# Patient Record
Sex: Female | Born: 1970 | Race: White | Hispanic: No | Marital: Married | Smoking: Never smoker
Health system: Southern US, Community
[De-identification: ages and names within clinical notes are randomized; demographics above are authoritative.]

## PROBLEM LIST (undated history)

## (undated) DIAGNOSIS — C801 Malignant (primary) neoplasm, unspecified: Secondary | ICD-10-CM

## (undated) DIAGNOSIS — F32A Depression, unspecified: Secondary | ICD-10-CM

## (undated) DIAGNOSIS — I10 Essential (primary) hypertension: Secondary | ICD-10-CM

## (undated) DIAGNOSIS — I499 Cardiac arrhythmia, unspecified: Secondary | ICD-10-CM

## (undated) DIAGNOSIS — T753XXA Motion sickness, initial encounter: Secondary | ICD-10-CM

## (undated) DIAGNOSIS — N979 Female infertility, unspecified: Secondary | ICD-10-CM

## (undated) DIAGNOSIS — I471 Supraventricular tachycardia, unspecified: Secondary | ICD-10-CM

## (undated) DIAGNOSIS — M1990 Unspecified osteoarthritis, unspecified site: Secondary | ICD-10-CM

## (undated) DIAGNOSIS — R87619 Unspecified abnormal cytological findings in specimens from cervix uteri: Secondary | ICD-10-CM

## (undated) DIAGNOSIS — F329 Major depressive disorder, single episode, unspecified: Secondary | ICD-10-CM

## (undated) DIAGNOSIS — IMO0002 Reserved for concepts with insufficient information to code with codable children: Secondary | ICD-10-CM

## (undated) HISTORY — PX: OTHER SURGICAL HISTORY: SHX169

## (undated) HISTORY — DX: Supraventricular tachycardia, unspecified: I47.10

## (undated) HISTORY — PX: ABLATION: SHX5711

## (undated) HISTORY — DX: Supraventricular tachycardia: I47.1

## (undated) HISTORY — DX: Essential (primary) hypertension: I10

## (undated) HISTORY — PX: DIAGNOSTIC LAPAROSCOPY: SUR761

## (undated) HISTORY — DX: Unspecified abnormal cytological findings in specimens from cervix uteri: R87.619

## (undated) HISTORY — PX: LABIAL ADHESION LYSIS: SHX324

## (undated) HISTORY — DX: Reserved for concepts with insufficient information to code with codable children: IMO0002

## (undated) HISTORY — PX: APPENDECTOMY: SHX54

## (undated) HISTORY — PX: LAPAROSCOPIC OVARIAN CYSTECTOMY: SUR786

## (undated) HISTORY — PX: ABDOMINAL HYSTERECTOMY: SHX81

---

## 1996-05-23 ENCOUNTER — Encounter: Payer: Self-pay | Admitting: Internal Medicine

## 1996-06-12 ENCOUNTER — Encounter: Payer: Self-pay | Admitting: Internal Medicine

## 2004-12-07 ENCOUNTER — Ambulatory Visit: Payer: Self-pay | Admitting: Internal Medicine

## 2005-10-18 ENCOUNTER — Ambulatory Visit: Payer: Self-pay | Admitting: Unknown Physician Specialty

## 2007-09-13 ENCOUNTER — Emergency Department: Payer: Self-pay | Admitting: Emergency Medicine

## 2007-09-13 ENCOUNTER — Encounter: Payer: Self-pay | Admitting: Internal Medicine

## 2007-09-13 ENCOUNTER — Other Ambulatory Visit: Payer: Self-pay

## 2007-09-17 ENCOUNTER — Encounter: Payer: Self-pay | Admitting: Internal Medicine

## 2007-10-18 ENCOUNTER — Encounter: Payer: Self-pay | Admitting: Internal Medicine

## 2009-09-07 ENCOUNTER — Encounter: Payer: Self-pay | Admitting: Internal Medicine

## 2009-09-15 ENCOUNTER — Encounter: Payer: Self-pay | Admitting: Internal Medicine

## 2009-10-05 ENCOUNTER — Ambulatory Visit: Payer: Self-pay | Admitting: Internal Medicine

## 2009-10-05 ENCOUNTER — Telehealth: Payer: Self-pay | Admitting: Internal Medicine

## 2009-10-05 DIAGNOSIS — R002 Palpitations: Secondary | ICD-10-CM

## 2009-10-05 DIAGNOSIS — I456 Pre-excitation syndrome: Secondary | ICD-10-CM | POA: Insufficient documentation

## 2009-10-05 DIAGNOSIS — I10 Essential (primary) hypertension: Secondary | ICD-10-CM

## 2009-10-12 ENCOUNTER — Ambulatory Visit: Payer: Self-pay | Admitting: Unknown Physician Specialty

## 2009-11-18 ENCOUNTER — Ambulatory Visit: Payer: Self-pay | Admitting: Internal Medicine

## 2009-11-19 LAB — CONVERTED CEMR LAB
CO2: 20 meq/L (ref 19–32)
Chloride: 105 meq/L (ref 96–112)
Hemoglobin: 14.8 g/dL (ref 12.0–15.0)
INR: 0.97 (ref <1.50)
Potassium: 4.6 meq/L (ref 3.5–5.3)
RBC: 4.84 M/uL (ref 3.87–5.11)
Sodium: 140 meq/L (ref 135–145)
WBC: 8.8 10*3/uL (ref 4.0–10.5)

## 2009-11-20 ENCOUNTER — Ambulatory Visit: Payer: Self-pay | Admitting: Internal Medicine

## 2009-11-20 ENCOUNTER — Ambulatory Visit (HOSPITAL_COMMUNITY): Admission: RE | Admit: 2009-11-20 | Discharge: 2009-11-21 | Payer: Self-pay | Admitting: Internal Medicine

## 2009-11-21 ENCOUNTER — Encounter: Payer: Self-pay | Admitting: Internal Medicine

## 2010-01-12 ENCOUNTER — Ambulatory Visit: Payer: Self-pay | Admitting: Internal Medicine

## 2010-03-12 ENCOUNTER — Encounter: Payer: Self-pay | Admitting: Internal Medicine

## 2010-10-27 ENCOUNTER — Ambulatory Visit: Payer: Self-pay | Admitting: Unknown Physician Specialty

## 2011-01-18 NOTE — Letter (Signed)
Summary: Discharge Summary  Discharge Summary   Imported By: Harlon Flor 04/01/2010 12:08:09  _____________________________________________________________________  External Attachment:    Type:   Image     Comment:   External Document

## 2011-01-18 NOTE — Assessment & Plan Note (Signed)
Summary: eph/jss   Visit Type:  Follow-up Referring Provider:  Elnita Maxwell JEFFERIES, MD Primary Provider:  Francia Greaves, MD   History of Present Illness: Mrs. Nicole Avila returns today for followup of her SVT.  She is a pleasant 40 yo woman with a h/o symptomatic SVT who underwent EPS/RFA of SVT several weeks ago.  She has had no recurrent symptoms and denies c/p, sob, or peripheral edema.  She recently went on a trip with her husband to the [place].  Current Medications (verified): 1)  Triamterene-Hctz 37.5-25 Mg Tabs (Triamterene-Hctz) .Marland Kitchen.. 1 By Mouth Once Daily 2)  Daily Multi  Tabs (Multiple Vitamins-Minerals) .Marland Kitchen.. 1 By Mouth Once Daily 3)  Advil 200 Mg Tabs (Ibuprofen) .Marland Kitchen.. 1 As Needed 4)  Aspirin 81 Mg Tbec (Aspirin) .... Take One Tablet By Mouth Daily  Allergies (verified): No Known Drug Allergies  Past History:  Past Medical History: Last updated: 10/05/2009 GENITAL HERPES ABNORMAL PAP SMEAR HISTORY OF SVT HYPERTENSION  Past Surgical History: Last updated: 10/05/2009 RUPURED APPENDIX CYST REMOVED FROM THE ANKLE HYSTEROSCOPY AND LAPAROTOMY  Review of Systems  The patient denies chest pain, syncope, dyspnea on exertion, and peripheral edema.    Vital Signs:  Patient profile:   40 year old female Height:      70 inches Weight:      231 pounds BMI:     33.26 Pulse rate:   85 / minute BP sitting:   122 / 70  (left arm)  Vitals Entered By: Laurance Flatten CMA (January 12, 2010 12:23 PM)  Physical Exam  General:  Well developed, well nourished, in no acute distress.  HEENT: normal Neck: supple. No JVD. Carotids 2+ bilaterally no bruits Cor: RRR no rubs, gallops or murmur Lungs: CTA Ab: soft, nontender. nondistended. No HSM. Good bowel sounds Ext: warm. no cyanosis, clubbing or edema Neuro: alert and oriented. Grossly nonfocal. affect pleasant    EKG  Procedure date:  01/12/2010  Findings:      Normal sinus rhythm with rate of:  85. IRBBB.  Impression &  Recommendations:  Problem # 1:  WOLFF (WOLFE)-PARKINSON-WHITE (WPW) SYNDROME (ICD-426.7) She has no additional evidence of WPW and no symptoms of SVT.  Will ask her to followup as needed. The following medications were removed from the medication list:    Metoprolol Tartrate 25 Mg Tabs (Metoprolol tartrate) .Marland Kitchen... Take one tablet by mouth twice a day Her updated medication list for this problem includes:    Aspirin 81 Mg Tbec (Aspirin) .Marland Kitchen... Take one tablet by mouth daily  Orders: EKG w/ Interpretation (93000)  Problem # 2:  ESSENTIAL HYPERTENSION, BENIGN (ICD-401.1) Her blood pressure is well controlled. The following medications were removed from the medication list:    Metoprolol Tartrate 25 Mg Tabs (Metoprolol tartrate) .Marland Kitchen... Take one tablet by mouth twice a day Her updated medication list for this problem includes:    Triamterene-hctz 37.5-25 Mg Tabs (Triamterene-hctz) .Marland Kitchen... 1 by mouth once daily    Aspirin 81 Mg Tbec (Aspirin) .Marland Kitchen... Take one tablet by mouth daily  Patient Instructions: 1)  Your physician recommends that you schedule a follow-up appointment as needed

## 2011-01-18 NOTE — Letter (Signed)
Summary: Medical Record Release  Medical Record Release   Imported By: Harlon Flor 04/01/2010 12:09:23  _____________________________________________________________________  External Attachment:    Type:   Image     Comment:   External Document

## 2011-07-12 ENCOUNTER — Encounter: Payer: Self-pay | Admitting: Internal Medicine

## 2011-11-07 ENCOUNTER — Ambulatory Visit: Payer: Self-pay | Admitting: Unknown Physician Specialty

## 2016-02-02 ENCOUNTER — Other Ambulatory Visit: Payer: Self-pay

## 2016-02-02 ENCOUNTER — Encounter: Payer: Self-pay | Admitting: *Deleted

## 2016-02-08 ENCOUNTER — Encounter
Admission: RE | Admit: 2016-02-08 | Discharge: 2016-02-08 | Disposition: A | Discharge status: Home / Self Care | Payer: BLUE CROSS/BLUE SHIELD | Source: Ambulatory Visit | Attending: Obstetrics & Gynecology | Admitting: Obstetrics & Gynecology

## 2016-02-08 ENCOUNTER — Encounter: Payer: Self-pay | Admitting: *Deleted

## 2016-02-08 DIAGNOSIS — I10 Essential (primary) hypertension: Secondary | ICD-10-CM | POA: Diagnosis not present

## 2016-02-08 DIAGNOSIS — F329 Major depressive disorder, single episode, unspecified: Secondary | ICD-10-CM | POA: Diagnosis not present

## 2016-02-08 DIAGNOSIS — N939 Abnormal uterine and vaginal bleeding, unspecified: Secondary | ICD-10-CM | POA: Diagnosis present

## 2016-02-08 DIAGNOSIS — N840 Polyp of corpus uteri: Secondary | ICD-10-CM | POA: Diagnosis not present

## 2016-02-08 DIAGNOSIS — Z79899 Other long term (current) drug therapy: Secondary | ICD-10-CM | POA: Diagnosis not present

## 2016-02-08 DIAGNOSIS — Z8249 Family history of ischemic heart disease and other diseases of the circulatory system: Secondary | ICD-10-CM | POA: Diagnosis not present

## 2016-02-08 DIAGNOSIS — I471 Supraventricular tachycardia: Secondary | ICD-10-CM | POA: Diagnosis not present

## 2016-02-08 DIAGNOSIS — R938 Abnormal findings on diagnostic imaging of other specified body structures: Secondary | ICD-10-CM | POA: Diagnosis not present

## 2016-02-08 LAB — CBC
HEMATOCRIT: 39.6 % (ref 35.0–47.0)
Hemoglobin: 13.8 g/dL (ref 12.0–16.0)
MCH: 30.8 pg (ref 26.0–34.0)
MCHC: 35 g/dL (ref 32.0–36.0)
MCV: 88.1 fL (ref 80.0–100.0)
Platelets: 285 10*3/uL (ref 150–440)
RBC: 4.49 MIL/uL (ref 3.80–5.20)
RDW: 13.8 % (ref 11.5–14.5)
WBC: 7.2 10*3/uL (ref 3.6–11.0)

## 2016-02-08 LAB — BASIC METABOLIC PANEL
Anion gap: 5 (ref 5–15)
BUN: 11 mg/dL (ref 6–20)
CHLORIDE: 102 mmol/L (ref 101–111)
CO2: 31 mmol/L (ref 22–32)
CREATININE: 0.68 mg/dL (ref 0.44–1.00)
Calcium: 9.7 mg/dL (ref 8.9–10.3)
GFR calc non Af Amer: >60 mL/min (ref >60)
GLUCOSE: 102 mg/dL — AB (ref 65–99)
Potassium: 3.4 mmol/L — ABNORMAL LOW (ref 3.5–5.1)
Sodium: 138 mmol/L (ref 135–145)

## 2016-02-08 LAB — TYPE AND SCREEN
ABO/RH(D): A NEG
Antibody Screen: NEGATIVE

## 2016-02-08 NOTE — Patient Instructions (Signed)
  Your procedure is scheduled on: 02/09/16 Report to Day Surgery. [facility] To find out your arrival time please call [phone] between 1PM - 3PM on 02/08/16  Remember: Instructions that are not followed completely may result in serious medical risk, up to and including death, or upon the discretion of your surgeon and anesthesiologist your surgery may need to be rescheduled.    _X_ 1. Do not eat food or drink liquids after midnight. No gum chewing or hard candies.     __X__ 2. No Alcohol for 24 hours before or after surgery.   ____ 3. Bring all medications with you on the day of surgery if instructed.    _X___ 4. Notify your doctor if there is any change in your medical condition     (cold, fever, infections).     Do not wear jewelry, make-up, hairpins, clips or nail polish.  Do not wear lotions, powders, or perfumes. You may wear deodorant.  Do not shave 48 hours prior to surgery. Men may shave face and neck.  Do not bring valuables to the hospital.    [facility] is not responsible for any belongings or valuables.               Contacts, dentures or bridgework may not be worn into surgery.  Leave your suitcase in the car. After surgery it may be brought to your room.  For patients admitted to the hospital, discharge time is determined by your                treatment team.   Patients discharged the day of surgery will not be allowed to drive home.   Please read over the following fact sheets that you were given:   Surgical Site Infection Prevention   ____ Take these medicines the morning of surgery with A SIP OF WATER:    1. NONE  2.   3.   4.  5.  6.  ____ Fleet Enema (as directed)   ____ Use CHG Soap as directed  ____ Use inhalers on the day of surgery  ____ Stop metformin 2 days prior to surgery    ____ Take 1/2 of usual insulin dose the night before surgery and none on the morning of surgery.   ____ Stop Coumadin/Plavix/aspirin on  __X__  Stop Anti-inflammatories on  NO IBUPROFEN UNTIL AFTER SURGERY   ____ Stop supplements until after surgery.    ____ Bring C-Pap to the hospital.

## 2016-02-09 ENCOUNTER — Encounter: Payer: Self-pay | Admitting: *Deleted

## 2016-02-09 ENCOUNTER — Ambulatory Visit: Payer: BLUE CROSS/BLUE SHIELD | Admitting: Anesthesiology

## 2016-02-09 ENCOUNTER — Ambulatory Visit
Admission: RE | Admit: 2016-02-09 | Discharge: 2016-02-09 | Disposition: A | Discharge status: Home / Self Care | Payer: BLUE CROSS/BLUE SHIELD | Source: Ambulatory Visit | Attending: Obstetrics & Gynecology | Admitting: Obstetrics & Gynecology

## 2016-02-09 ENCOUNTER — Encounter
Admission: RE | Disposition: A | Discharge status: Home / Self Care | Payer: Self-pay | Source: Ambulatory Visit | Attending: Obstetrics & Gynecology

## 2016-02-09 DIAGNOSIS — I471 Supraventricular tachycardia: Secondary | ICD-10-CM | POA: Insufficient documentation

## 2016-02-09 DIAGNOSIS — R938 Abnormal findings on diagnostic imaging of other specified body structures: Secondary | ICD-10-CM | POA: Insufficient documentation

## 2016-02-09 DIAGNOSIS — N840 Polyp of corpus uteri: Secondary | ICD-10-CM | POA: Insufficient documentation

## 2016-02-09 DIAGNOSIS — N939 Abnormal uterine and vaginal bleeding, unspecified: Secondary | ICD-10-CM | POA: Insufficient documentation

## 2016-02-09 DIAGNOSIS — F329 Major depressive disorder, single episode, unspecified: Secondary | ICD-10-CM | POA: Insufficient documentation

## 2016-02-09 DIAGNOSIS — Z79899 Other long term (current) drug therapy: Secondary | ICD-10-CM | POA: Insufficient documentation

## 2016-02-09 DIAGNOSIS — Z8249 Family history of ischemic heart disease and other diseases of the circulatory system: Secondary | ICD-10-CM | POA: Insufficient documentation

## 2016-02-09 DIAGNOSIS — I10 Essential (primary) hypertension: Secondary | ICD-10-CM | POA: Insufficient documentation

## 2016-02-09 HISTORY — DX: Cardiac arrhythmia, unspecified: I49.9

## 2016-02-09 HISTORY — PX: HYSTEROSCOPY WITH D & C: SHX1775

## 2016-02-09 HISTORY — DX: Female infertility, unspecified: N97.9

## 2016-02-09 HISTORY — DX: Major depressive disorder, single episode, unspecified: F32.9

## 2016-02-09 HISTORY — DX: Depression, unspecified: F32.A

## 2016-02-09 LAB — ABO/RH: ABO/RH(D): A NEG

## 2016-02-09 LAB — POCT PREGNANCY, URINE: Preg Test, Ur: NEGATIVE

## 2016-02-09 SURGERY — DILATATION AND CURETTAGE /HYSTEROSCOPY
Anesthesia: General

## 2016-02-09 MED ORDER — DEXAMETHASONE SODIUM PHOSPHATE 10 MG/ML IJ SOLN
INTRAMUSCULAR | Status: DC | PRN
  Administered 2016-02-09: 8 mg via INTRAVENOUS

## 2016-02-09 MED ORDER — OXYCODONE HCL 5 MG PO TABS
5.0000 mg | ORAL_TABLET | Freq: Once | ORAL | Status: AC | PRN
  Administered 2016-02-09: 5 mg via ORAL

## 2016-02-09 MED ORDER — LIDOCAINE HCL 1 % IJ SOLN
INTRAMUSCULAR | Status: DC | PRN
  Administered 2016-02-09: 10 mL

## 2016-02-09 MED ORDER — ONDANSETRON HCL 4 MG/2ML IJ SOLN
INTRAMUSCULAR | Status: DC | PRN
  Administered 2016-02-09: 4 mg via INTRAVENOUS

## 2016-02-09 MED ORDER — OXYCODONE HCL 5 MG/5ML PO SOLN: 5.0000 mg | Freq: Once | ORAL | Status: AC | PRN

## 2016-02-09 MED ORDER — FENTANYL CITRATE (PF) 100 MCG/2ML IJ SOLN
INTRAMUSCULAR | Status: AC
  Filled 2016-02-09: qty 2

## 2016-02-09 MED ORDER — FENTANYL CITRATE (PF) 100 MCG/2ML IJ SOLN
INTRAMUSCULAR | Status: DC | PRN
  Administered 2016-02-09: 50 ug via INTRAVENOUS

## 2016-02-09 MED ORDER — OXYCODONE HCL 5 MG PO TABS
ORAL_TABLET | ORAL | Status: AC
  Filled 2016-02-09: qty 1

## 2016-02-09 MED ORDER — FAMOTIDINE 20 MG PO TABS
20.0000 mg | ORAL_TABLET | Freq: Once | ORAL | Status: AC
  Administered 2016-02-09: 20 mg via ORAL

## 2016-02-09 MED ORDER — FAMOTIDINE 20 MG PO TABS
ORAL_TABLET | ORAL | Status: AC
  Filled 2016-02-09: qty 1

## 2016-02-09 MED ORDER — LACTATED RINGERS IV SOLN
INTRAVENOUS | Status: DC
  Administered 2016-02-09 (×2): via INTRAVENOUS

## 2016-02-09 MED ORDER — LIDOCAINE HCL (CARDIAC) 20 MG/ML IV SOLN
INTRAVENOUS | Status: DC | PRN
  Administered 2016-02-09: 40 mg via INTRAVENOUS

## 2016-02-09 MED ORDER — KETOROLAC TROMETHAMINE 30 MG/ML IJ SOLN
INTRAMUSCULAR | Status: AC
  Filled 2016-02-09: qty 1

## 2016-02-09 MED ORDER — FENTANYL CITRATE (PF) 100 MCG/2ML IJ SOLN
25.0000 ug | INTRAMUSCULAR | Status: DC | PRN
  Administered 2016-02-09: 50 ug via INTRAVENOUS
  Administered 2016-02-09: 25 ug via INTRAVENOUS
  Administered 2016-02-09: 50 ug via INTRAVENOUS

## 2016-02-09 MED ORDER — PROPOFOL 10 MG/ML IV BOLUS
INTRAVENOUS | Status: DC | PRN
  Administered 2016-02-09: 170 mg via INTRAVENOUS

## 2016-02-09 MED ORDER — KETOROLAC TROMETHAMINE 30 MG/ML IJ SOLN
30.0000 mg | Freq: Four times a day (QID) | INTRAMUSCULAR | Status: DC
Start: 2016-02-09 — End: 2016-02-09
  Administered 2016-02-09: 30 mg via INTRAVENOUS

## 2016-02-09 MED ORDER — MIDAZOLAM HCL 2 MG/2ML IJ SOLN
INTRAMUSCULAR | Status: DC | PRN
  Administered 2016-02-09: 2 mg via INTRAVENOUS

## 2016-02-09 MED ORDER — LIDOCAINE HCL (PF) 1 % IJ SOLN
INTRAMUSCULAR | Status: AC
  Filled 2016-02-09: qty 30

## 2016-02-09 SURGICAL SUPPLY — 22 items
CATH ROBINSON RED A/P 16FR (CATHETERS) ×3 IMPLANT
CORD URO TURP 10FT (MISCELLANEOUS) ×3 IMPLANT
ELECT REM PT RETURN 9FT ADLT (ELECTROSURGICAL) ×3
ELECT RESECT POWERBALL 24F (MISCELLANEOUS) ×3 IMPLANT
ELECTRODE REM PT RTRN 9FT ADLT (ELECTROSURGICAL) ×1 IMPLANT
GLOVE BIOGEL PI IND STRL 6.5 (GLOVE) ×1 IMPLANT
GLOVE BIOGEL PI IND STRL 7.5 (GLOVE) ×1 IMPLANT
GLOVE BIOGEL PI INDICATOR 6.5 (GLOVE) ×2
GLOVE BIOGEL PI INDICATOR 7.5 (GLOVE) ×2
GLOVE SURG SYN 6.5 ES PF (GLOVE) ×3 IMPLANT
GOWN STRL REUS W/ TWL LRG LVL3 (GOWN DISPOSABLE) ×2 IMPLANT
GOWN STRL REUS W/TWL LRG LVL3 (GOWN DISPOSABLE) ×4
IV LACTATED RINGERS 1000ML (IV SOLUTION) ×3 IMPLANT
KIT RM TURNOVER CYSTO AR (KITS) ×3 IMPLANT
NEEDLE SPNL 22GX3.5 QUINCKE BK (NEEDLE) IMPLANT
PACK DNC HYST (MISCELLANEOUS) ×3 IMPLANT
PAD OB MATERNITY 4.3X12.25 (PERSONAL CARE ITEMS) ×3 IMPLANT
PAD PREP 24X41 OB/GYN DISP (PERSONAL CARE ITEMS) ×3 IMPLANT
SYRINGE 10CC LL (SYRINGE) ×3 IMPLANT
TUBING CONNECTING 10 (TUBING) ×2 IMPLANT
TUBING CONNECTING 10' (TUBING) ×1
TUBING HYSTEROSCOPY DOLPHIN (MISCELLANEOUS) ×3 IMPLANT

## 2016-02-09 NOTE — Transfer of Care (Signed)
Immediate Anesthesia Transfer of Care Note  Patient: Nicole Avila  Procedure(s) Performed: Procedure(s): DILATATION AND CURETTAGE /HYSTEROSCOPY (N/A)  Patient Location: PACU  Anesthesia Type:General  Level of Consciousness: sedated  Airway & Oxygen Therapy: Patient Spontanous Breathing and Patient connected to face mask oxygen  Post-op Assessment: Report given to RN and Post -op Vital signs reviewed and stable  Post vital signs: Reviewed and stable  Last Vitals:  Filed Vitals:   02/09/16 0622 02/09/16 0834  BP: 141/75 133/83  Pulse: 71 67  Temp: 36.6 C 36.6 C  Resp: 16 16    Complications: No apparent anesthesia complications

## 2016-02-09 NOTE — Anesthesia Procedure Notes (Signed)
Procedure Name: LMA Insertion Date/Time: 02/09/2016 7:40 AM Performed by: Allean Found Pre-anesthesia Checklist: Patient identified, Emergency Drugs available, Suction available, Patient being monitored and Timeout performed Patient Re-evaluated:Patient Re-evaluated prior to inductionOxygen Delivery Method: Circle system utilized Preoxygenation: Pre-oxygenation with 100% oxygen Intubation Type: IV induction Ventilation: Mask ventilation without difficulty LMA: LMA inserted LMA Size: 4.0 Number of attempts: 1 Placement Confirmation: positive ETCO2 and breath sounds checked- equal and bilateral Tube secured with: Tape Dental Injury: Teeth and Oropharynx as per pre-operative assessment

## 2016-02-09 NOTE — Anesthesia Preprocedure Evaluation (Signed)
Anesthesia Evaluation  Patient identified by MRN, date of birth, ID band Patient awake    Reviewed: Allergy & Precautions, H&P , NPO status , Patient's Chart, lab work & pertinent test results  History of Anesthesia Complications Negative for: history of anesthetic complications  Airway Mallampati: II  TM Distance: >3 FB Neck ROM: full    Dental  (+) Poor Dentition   Pulmonary neg pulmonary ROS, neg shortness of breath,    Pulmonary exam normal breath sounds clear to auscultation       Cardiovascular Exercise Tolerance: Good hypertension, (-) angina(-) DOE Normal cardiovascular exam+ dysrhythmias (s/p ablation) Supra Ventricular Tachycardia  Rhythm:regular Rate:Normal     Neuro/Psych PSYCHIATRIC DISORDERS Depression negative neurological ROS     GI/Hepatic negative GI ROS, Neg liver ROS,   Endo/Other  negative endocrine ROS  Renal/GU negative Renal ROS  negative genitourinary   Musculoskeletal   Abdominal   Peds  Hematology negative hematology ROS (+)   Anesthesia Other Findings Past Medical History:   Genital herpes                                               Abnormal finding on Pap smear                                SVT (supraventricular tachycardia) (HCC)                       Comment:hx   HTN (hypertension)                                           Dysrhythmia                                                  Infertility, female                                          Depression                                                     Comment:panic attacks  Past Surgical History:   APPENDECTOMY                                                    Comment:ruptured   cyst removed - ankle                                          hysterectomy and laparotomy  DIAGNOSTIC LAPAROSCOPY                                        LABIAL ADHESION LYSIS                                          LAPAROSCOPIC OVARIAN CYSTECTOMY                 Left              ABLATION                                                        Comment:cardiac 2011   no followed by cardio  BMI    Body Mass Index   32.14 kg/m 2      Reproductive/Obstetrics negative OB ROS                             Anesthesia Physical Anesthesia Plan  ASA: III  Anesthesia Plan: General LMA   Post-op Pain Management:    Induction:   Airway Management Planned:   Additional Equipment:   Intra-op Plan:   Post-operative Plan:   Informed Consent: I have reviewed the patients History and Physical, chart, labs and discussed the procedure including the risks, benefits and alternatives for the proposed anesthesia with the patient or authorized representative who has indicated his/her understanding and acceptance.   Dental Advisory Given  Plan Discussed with: Anesthesiologist, CRNA and Surgeon  Anesthesia Plan Comments:         Anesthesia Quick Evaluation

## 2016-02-09 NOTE — Anesthesia Postprocedure Evaluation (Signed)
Anesthesia Post Note  Patient: Nicole Avila  Procedure(s) Performed: Procedure(s) (LRB): DILATATION AND CURETTAGE /HYSTEROSCOPY (N/A)  Patient location during evaluation: PACU Anesthesia Type: General Level of consciousness: awake and alert Pain management: pain level controlled Vital Signs Assessment: post-procedure vital signs reviewed and stable Respiratory status: spontaneous breathing, nonlabored ventilation, respiratory function stable and patient connected to nasal cannula oxygen Cardiovascular status: blood pressure returned to baseline and stable Postop Assessment: no signs of nausea or vomiting Anesthetic complications: no    Last Vitals:  Filed Vitals:   02/09/16 0922 02/09/16 0936  BP: 148/60 127/67  Pulse: 65 67  Temp: 36.6 C   Resp: 14 14    Last Pain:  Filed Vitals:   02/09/16 0938  PainSc: 4                  Precious Haws Piscitello

## 2016-02-09 NOTE — H&P (Addendum)
H&P Update  PLEASE SEE PAPER H&P  Pt was last seen in my office, and complete history and physical performed.  The surgical history has been reviewed and remains accurate without interval change. The patient was re-examined and patient's physiologic condition has not changed significantly in the last 30 days.  No new pharmacological allergies or types of therapy has been initiated.  No Known Allergies  Past Medical History  Diagnosis Date  . Genital herpes   . Abnormal finding on Pap smear   . SVT (supraventricular tachycardia) (HCC)     hx  . HTN (hypertension)   . Dysrhythmia   . Infertility, female   . Depression     panic attacks   Past Surgical History  Procedure Laterality Date  . Appendectomy      ruptured  . Cyst removed - ankle    . Hysterectomy and laparotomy    . Diagnostic laparoscopy    . Labial adhesion lysis    . Laparoscopic ovarian cystectomy Left   . Ablation      cardiac 2011   no followed by cardio    BP 141/75 mmHg  Pulse 71  Temp(Src) 97.9 F (36.6 C) (Oral)  Resp 16  Ht 5\' 10"  (1.778 m)  Wt 101.606 kg (224 lb)  BMI 32.14 kg/m2  SpO2 98%  LMP 12/08/2015 (Approximate)  NAD RRR no murmurs CTAB, no wheezing, resps unlabored +BS, soft, NTTP No c/c/e Pelvic exam deferred  The above history was confirmed with the patient. The condition still exists that makes this procedure necessary. Surgical plan includes hysteroscopy and dilation curettage, as confirmed on the consent. The treatment plan remains the same, without new options for care.  The patient understands the potential benefits and risks and the consents have been signed and placed on the chart.     Larey Days, MD Attending Obstetrician Gynecologist [facility]  ADDENDUM:  Patient's past medical history is listed in our computer system as "hysterectomy"  As I do not enter these surgeries, I do not know how to change them.  This is erroneous.  She  has had a hysteroscopy.  Her uterus is still intact. CW

## 2016-02-09 NOTE — Discharge Instructions (Signed)
You should expect to have some cramping and vaginal bleeding for about a week. This should taper off and subside, much like a period. If heavy bleeding continues or gets worse, you should contact the office for an earlier appointment.   Please call the office or physician on call for fever >101, severe pain, and heavy bleeding.   [phone]  NOTHING IN THE VAGINA FOR 2 WEEKS!!  Use Ibuprofen, Tylenol, and heating pad for cramps.General Anesthesia, Adult General anesthesia is a sleep-like state of non-feeling produced by medicines (anesthetics). General anesthesia prevents you from being alert and feeling pain during a medical procedure. Your caregiver may recommend general anesthesia if your procedure:  Is long.  Is painful or uncomfortable.  Would be frightening to see or hear.  Requires you to be still.  Affects your breathing.  Causes significant blood loss. LET YOUR CAREGIVER KNOW ABOUT:  Allergies to food or medicine.  Medicines taken, including vitamins, herbs, eyedrops, over-the-counter medicines, and creams.  Use of steroids (by mouth or creams).  Previous problems with anesthetics or numbing medicines, including problems experienced by relatives.  History of bleeding problems or blood clots.  Previous surgeries and types of anesthetics received.  Possibility of pregnancy, if this applies.  Use of cigarettes, alcohol, or illegal drugs.  Any health condition(s), especially diabetes, sleep apnea, and high blood pressure. RISKS AND COMPLICATIONS General anesthesia rarely causes complications. However, if complications do occur, they can be life threatening. Complications include:  A lung infection.  A stroke.  A heart attack.  Waking up during the procedure. When this occurs, the patient may be unable to move and communicate that he or she is awake. The patient may feel severe pain. Older adults and adults with serious medical problems are more likely to have  complications than adults who are young and healthy. Some complications can be prevented by answering all of your caregiver's questions thoroughly and by following all pre-procedure instructions. It is important to tell your caregiver if any of the pre-procedure instructions, especially those related to diet, were not followed. Any food or liquid in the stomach can cause problems when you are under general anesthesia. BEFORE THE PROCEDURE  Ask your caregiver if you will have to spend the night at the hospital. If you will not have to spend the night, arrange to have an adult drive you and stay with you for 24 hours.  Follow your caregiver's instructions if you are taking dietary supplements or medicines. Your caregiver may tell you to stop taking them or to reduce your dosage.  Do not smoke for as long as possible before your procedure. If possible, stop smoking 3-6 weeks before the procedure.  Do not take new dietary supplements or medicines within 1 week of your procedure unless your caregiver approves them.  Do not eat within 8 hours of your procedure or as directed by your caregiver. Drink only clear liquids, such as water, black coffee (without milk or cream), and fruit juices (without pulp).  Do not drink within 3 hours of your procedure or as directed by your caregiver.  You may brush your teeth on the morning of the procedure, but make sure to spit out the toothpaste and water when finished. PROCEDURE  You will receive anesthetics through a mask, through an intravenous (IV) access tube, or through both. A doctor who specializes in anesthesia (anesthesiologist) or a nurse who specializes in anesthesia (nurse anesthetist) or both will stay with you throughout the procedure to make sure  you remain unconscious. He or she will also watch your blood pressure, pulse, and oxygen levels to make sure that the anesthetics do not cause any problems. Once you are asleep, a breathing tube or mask may be  used to help you breathe. AFTER THE PROCEDURE You will wake up after the procedure is complete. You may be in the room where the procedure was performed or in a recovery area. You may have a sore throat if a breathing tube was used. You may also feel:  Dizzy.  Weak.  Drowsy.  Confused.  Nauseous.  Cold. These are all normal responses and can be expected to last for up to 24 hours after the procedure is complete. A caregiver will tell you when you are ready to go home. This will usually be when you are fully awake and in stable condition.   This information is not intended to replace advice given to you by your health care provider. Make sure you discuss any questions you have with your health care provider.   Document Released: 03/13/2008 Document Revised: 12/26/2014 Document Reviewed: 04/04/2012 Elsevier Interactive Patient Education [employer].

## 2016-02-09 NOTE — Op Note (Signed)
Operative Report Hysteroscopy, Dilation and Curettage 02/09/2016  Patient:  Nicole Avila  45 y.o. female Preoperative diagnosis:  ABNORMAL UTERINE BLEEDING,THICKENED ENDOMETRIUM Postoperative diagnosis:  ABNORMAL UTERINE BLEEDING, Uterine polyps  PROCEDURE:  Procedure(s): DILATATION AND CURETTAGE /HYSTEROSCOPY (N/A) Surgeon:  Surgeon(s) and Role:    * Chelsea Loletha Grayer Ward, MD - Primary Anesthesia:  LMA I/O: Total I/O In: 600 [I.V.:600] Out: - minimal blood loss Specimens:  Endometrial curettings Complications: None Apparent Disposition:  VS stable to PACU  Findings: Uterus, mobile, normal size, sounding to 10.5 cm; normal cervix, vagina, perineum. Hysteroscopic findings:  Small, numerous polyps, one necrotic.  Indication for procedure/Consents: 45 y.o.  here for scheduled surgery for the aforementioned diagnoses.  Risks of surgery were discussed with the patient including but not limited to: bleeding which may require transfusion; infection which may require antibiotics; injury to uterus or surrounding organs; intrauterine scarring which may impair future fertility; need for additional procedures including laparotomy or laparoscopy; and other postoperative/anesthesia complications. Written informed consent was obtained.    Procedure Details:   The patient was then taken to the operating room where anesthesia was administered and was found to be adequate.  After a formal and adequate timeout was performed, she was placed in the dorsal lithotomy position and examined with the above findings. She was then prepped and draped in the sterile manner.  A speculum was then placed in the patient's vagina and a single tooth tenaculum was applied to the anterior lip of the cervix.   The uterus was sounded to 10.5cm. Her cervix was serially dilated to accommodate the  hysteroscope with findings as above. A sharp curettage was then performed until there was a gritty texture in all four quadrants. The  specimen(s) was handed off to nursing.  The camera was reinserted and confirmed the uterus had been evacuated. The tenaculum was removed from the anterior lip of the cervix and the vaginal speculum was removed after noting good hemostasis. The patient tolerated the procedure well and was taken to the recovery area awake, extubated and in stable condition.  The patient will be discharged to home as per PACU criteria.  Routine postoperative instructions given. She will follow up in the clinic in two to four weeks for postoperative evaluation.  Larey Days, MD [facility] OBGYN Attending Gynecologist

## 2016-02-10 LAB — SURGICAL PATHOLOGY

## 2016-04-12 ENCOUNTER — Encounter: Payer: Self-pay | Admitting: *Deleted

## 2016-04-12 ENCOUNTER — Other Ambulatory Visit: Payer: BLUE CROSS/BLUE SHIELD

## 2016-04-12 NOTE — Patient Instructions (Signed)
  Your procedure is scheduled on: 04-14-16 (THURSDAY) Report to [place] To find out your arrival time please call [phone] between 1PM - 3PM on 04-13-16 [facility])  Remember: Instructions that are not followed completely may result in serious medical risk, up to and including death, or upon the discretion of your surgeon and anesthesiologist your surgery may need to be rescheduled.    _X___ 1. Do not eat food or drink liquids after midnight. No gum chewing or hard candies.     _X___ 2. No Alcohol for 24 hours before or after surgery.   ____ 3. Bring all medications with you on the day of surgery if instructed.    _X___ 4. Notify your doctor if there is any change in your medical condition     (cold, fever, infections).     Do not wear jewelry, make-up, hairpins, clips or nail polish.  Do not wear lotions, powders, or perfumes. You may wear deodorant.  Do not shave 48 hours prior to surgery. Men may shave face and neck.  Do not bring valuables to the hospital.    [facility] is not responsible for any belongings or valuables.               Contacts, dentures or bridgework may not be worn into surgery.  Leave your suitcase in the car. After surgery it may be brought to your room.  For patients admitted to the hospital, discharge time is determined by your treatment team.   Patients discharged the day of surgery will not be allowed to drive home.   Please read over the following fact sheets that you were given:      ____ Take these medicines the morning of surgery with A SIP OF WATER:    1. NONE  2.   3.   4.  5.  6.  ____ Fleet Enema (as directed)   ____ Use CHG Soap as directed  ____ Use inhalers on the day of surgery  ____ Stop metformin 2 days prior to surgery    ____ Take 1/2 of usual insulin dose the night before surgery and none on the morning of surgery.   ____ Stop Coumadin/Plavix/aspirin-N/A  _X___ Stop  Anti-inflammatories-STOP IBUPROFEN NOW-NO NSAIDS OR ASPIRIN PRODUCTS-TYLENOL OK TO TAKE   _X___ Stop supplements until after surgery-STOP VITAMIN C NOW   ____ Bring C-Pap to the hospital.

## 2016-04-13 ENCOUNTER — Encounter
Admission: RE | Admit: 2016-04-13 | Discharge: 2016-04-13 | Disposition: A | Discharge status: Home / Self Care | Payer: BLUE CROSS/BLUE SHIELD | Source: Ambulatory Visit | Attending: Obstetrics & Gynecology | Admitting: Obstetrics & Gynecology

## 2016-04-13 DIAGNOSIS — N939 Abnormal uterine and vaginal bleeding, unspecified: Secondary | ICD-10-CM | POA: Diagnosis present

## 2016-04-13 DIAGNOSIS — Z8719 Personal history of other diseases of the digestive system: Secondary | ICD-10-CM | POA: Diagnosis not present

## 2016-04-13 DIAGNOSIS — I471 Supraventricular tachycardia: Secondary | ICD-10-CM | POA: Diagnosis not present

## 2016-04-13 DIAGNOSIS — F329 Major depressive disorder, single episode, unspecified: Secondary | ICD-10-CM | POA: Diagnosis not present

## 2016-04-13 DIAGNOSIS — N888 Other specified noninflammatory disorders of cervix uteri: Secondary | ICD-10-CM | POA: Diagnosis not present

## 2016-04-13 DIAGNOSIS — Z8249 Family history of ischemic heart disease and other diseases of the circulatory system: Secondary | ICD-10-CM | POA: Diagnosis not present

## 2016-04-13 DIAGNOSIS — I10 Essential (primary) hypertension: Secondary | ICD-10-CM | POA: Diagnosis not present

## 2016-04-13 DIAGNOSIS — Z79899 Other long term (current) drug therapy: Secondary | ICD-10-CM | POA: Diagnosis not present

## 2016-04-13 DIAGNOSIS — K660 Peritoneal adhesions (postprocedural) (postinfection): Secondary | ICD-10-CM | POA: Diagnosis not present

## 2016-04-13 DIAGNOSIS — F410 Panic disorder [episodic paroxysmal anxiety] without agoraphobia: Secondary | ICD-10-CM | POA: Diagnosis not present

## 2016-04-13 LAB — BASIC METABOLIC PANEL
Anion gap: 9 (ref 5–15)
BUN: 17 mg/dL (ref 6–20)
CO2: 23 mmol/L (ref 22–32)
Calcium: 9.2 mg/dL (ref 8.9–10.3)
Chloride: 106 mmol/L (ref 101–111)
Creatinine, Ser: 0.76 mg/dL (ref 0.44–1.00)
GFR calc Af Amer: >60 mL/min (ref >60)
GFR calc non Af Amer: >60 mL/min (ref >60)
Glucose, Bld: 125 mg/dL — ABNORMAL HIGH (ref 65–99)
Potassium: 3.6 mmol/L (ref 3.5–5.1)
Sodium: 138 mmol/L (ref 135–145)

## 2016-04-13 LAB — CBC
HEMATOCRIT: 38.9 % (ref 35.0–47.0)
HEMOGLOBIN: 13.6 g/dL (ref 12.0–16.0)
MCH: 30.6 pg (ref 26.0–34.0)
MCHC: 35 g/dL (ref 32.0–36.0)
MCV: 87.4 fL (ref 80.0–100.0)
Platelets: 264 10*3/uL (ref 150–440)
RBC: 4.45 MIL/uL (ref 3.80–5.20)
RDW: 13.7 % (ref 11.5–14.5)
WBC: 8.9 10*3/uL (ref 3.6–11.0)

## 2016-04-13 LAB — TYPE AND SCREEN
ABO/RH(D): A NEG
Antibody Screen: NEGATIVE

## 2016-04-13 NOTE — Pre-Procedure Instructions (Deleted)
Pt came in to PAT to get some labs drawn per surgeons orders-pt was stuck twice by the phlebotomist without getting any blood-pt had told phlebotomist that she could not look at the needle while her blood was being drawn- phlebotomist came to get me to see if I could get blood from pt-Upon walking into room, myself and another nurse observed pt was very pale and was slouched over to the side of the chair, eyes open, trying to hold on to her water. We reclined pt back in the chair and pt came too and was able to communicate with [place] and said that she had not eaten breakfast and that she normally has already eaten at this time.  I kept pts legs elevated and sat her up slightly, put a cold rag over her forehead and she states that she was feeling a lot better.  Blood was drawn and pt tolerated this well. Pt stood up with me beside her and she said she felt fine. I walked her out to the waiting room to her boyfriend who said he would drive her home and i instructed him to get her something to eat.  He verbalized he would and she did also. Reviewed labs once they came back and her glucose was 105.

## 2016-04-14 ENCOUNTER — Ambulatory Visit
Admission: RE | Admit: 2016-04-14 | Discharge: 2016-04-14 | Disposition: A | Discharge status: Home / Self Care | Payer: BLUE CROSS/BLUE SHIELD | Source: Ambulatory Visit | Attending: Obstetrics & Gynecology | Admitting: Obstetrics & Gynecology

## 2016-04-14 ENCOUNTER — Ambulatory Visit: Payer: BLUE CROSS/BLUE SHIELD | Admitting: Anesthesiology

## 2016-04-14 ENCOUNTER — Encounter
Admission: RE | Disposition: A | Discharge status: Home / Self Care | Payer: Self-pay | Source: Ambulatory Visit | Attending: Obstetrics & Gynecology

## 2016-04-14 ENCOUNTER — Encounter: Payer: Self-pay | Admitting: *Deleted

## 2016-04-14 DIAGNOSIS — I471 Supraventricular tachycardia: Secondary | ICD-10-CM | POA: Insufficient documentation

## 2016-04-14 DIAGNOSIS — K660 Peritoneal adhesions (postprocedural) (postinfection): Secondary | ICD-10-CM | POA: Insufficient documentation

## 2016-04-14 DIAGNOSIS — F329 Major depressive disorder, single episode, unspecified: Secondary | ICD-10-CM | POA: Insufficient documentation

## 2016-04-14 DIAGNOSIS — Z8719 Personal history of other diseases of the digestive system: Secondary | ICD-10-CM | POA: Insufficient documentation

## 2016-04-14 DIAGNOSIS — N939 Abnormal uterine and vaginal bleeding, unspecified: Secondary | ICD-10-CM | POA: Insufficient documentation

## 2016-04-14 DIAGNOSIS — I10 Essential (primary) hypertension: Secondary | ICD-10-CM | POA: Insufficient documentation

## 2016-04-14 DIAGNOSIS — Z79899 Other long term (current) drug therapy: Secondary | ICD-10-CM | POA: Insufficient documentation

## 2016-04-14 DIAGNOSIS — Z8249 Family history of ischemic heart disease and other diseases of the circulatory system: Secondary | ICD-10-CM | POA: Insufficient documentation

## 2016-04-14 DIAGNOSIS — N888 Other specified noninflammatory disorders of cervix uteri: Secondary | ICD-10-CM | POA: Insufficient documentation

## 2016-04-14 DIAGNOSIS — F410 Panic disorder [episodic paroxysmal anxiety] without agoraphobia: Secondary | ICD-10-CM | POA: Insufficient documentation

## 2016-04-14 HISTORY — PX: LAPAROSCOPIC HYSTERECTOMY: SHX1926

## 2016-04-14 LAB — POCT PREGNANCY, URINE: Preg Test, Ur: NEGATIVE

## 2016-04-14 SURGERY — HYSTERECTOMY, TOTAL, LAPAROSCOPIC
Anesthesia: General | Laterality: Bilateral | Wound class: Clean Contaminated

## 2016-04-14 MED ORDER — OXYCODONE HCL 5 MG PO TABS
5.0000 mg | ORAL_TABLET | Freq: Once | ORAL | Status: AC | PRN
  Administered 2016-04-14: 5 mg via ORAL

## 2016-04-14 MED ORDER — OXYCODONE HCL 5 MG PO TABS
ORAL_TABLET | ORAL | Status: AC
  Filled 2016-04-14: qty 1

## 2016-04-14 MED ORDER — ROCURONIUM BROMIDE 100 MG/10ML IV SOLN
INTRAVENOUS | Status: DC | PRN
  Administered 2016-04-14 (×2): 10 mg via INTRAVENOUS
  Administered 2016-04-14: 20 mg via INTRAVENOUS

## 2016-04-14 MED ORDER — OXYCODONE HCL 5 MG/5ML PO SOLN
5.0000 mg | Freq: Once | ORAL | Status: AC | PRN
Start: 2016-04-14 — End: 2016-04-14

## 2016-04-14 MED ORDER — ONDANSETRON HCL 4 MG/2ML IJ SOLN
INTRAMUSCULAR | Status: DC | PRN
  Administered 2016-04-14: 4 mg via INTRAVENOUS

## 2016-04-14 MED ORDER — LACTATED RINGERS IV SOLN
INTRAVENOUS | Status: DC
  Administered 2016-04-14: 12:00:00 via INTRAVENOUS

## 2016-04-14 MED ORDER — FAMOTIDINE 20 MG PO TABS
20.0000 mg | ORAL_TABLET | Freq: Once | ORAL | Status: AC
  Administered 2016-04-14: 20 mg via ORAL

## 2016-04-14 MED ORDER — FENTANYL CITRATE (PF) 100 MCG/2ML IJ SOLN
INTRAMUSCULAR | Status: AC
  Administered 2016-04-14: 50 ug via INTRAVENOUS
  Filled 2016-04-14: qty 2

## 2016-04-14 MED ORDER — ACETAMINOPHEN 325 MG PO TABS: 650.0000 mg | ORAL_TABLET | ORAL | Status: DC | PRN

## 2016-04-14 MED ORDER — CEFAZOLIN SODIUM-DEXTROSE 2-4 GM/100ML-% IV SOLN
2.0000 g | Freq: Once | INTRAVENOUS | Status: AC
  Administered 2016-04-14: 2 g via INTRAVENOUS

## 2016-04-14 MED ORDER — HEPARIN SODIUM (PORCINE) 5000 UNIT/ML IJ SOLN
INTRAMUSCULAR | Status: AC
  Filled 2016-04-14: qty 1

## 2016-04-14 MED ORDER — MORPHINE SULFATE (PF) 2 MG/ML IV SOLN: 1.0000 mg | INTRAVENOUS | Status: DC | PRN

## 2016-04-14 MED ORDER — CEFAZOLIN SODIUM-DEXTROSE 2-4 GM/100ML-% IV SOLN
INTRAVENOUS | Status: AC
  Filled 2016-04-14: qty 100

## 2016-04-14 MED ORDER — MIDAZOLAM HCL 5 MG/5ML IJ SOLN
INTRAMUSCULAR | Status: DC | PRN
  Administered 2016-04-14: 2 mg via INTRAVENOUS

## 2016-04-14 MED ORDER — FENTANYL CITRATE (PF) 100 MCG/2ML IJ SOLN
INTRAMUSCULAR | Status: DC | PRN
  Administered 2016-04-14: 50 ug via INTRAVENOUS
  Administered 2016-04-14: 100 ug via INTRAVENOUS
  Administered 2016-04-14: 50 ug via INTRAVENOUS

## 2016-04-14 MED ORDER — SUGAMMADEX SODIUM 200 MG/2ML IV SOLN
INTRAVENOUS | Status: DC | PRN
  Administered 2016-04-14: 200 mg via INTRAVENOUS

## 2016-04-14 MED ORDER — KETOROLAC TROMETHAMINE 30 MG/ML IJ SOLN: 30.0000 mg | Freq: Four times a day (QID) | INTRAMUSCULAR | Status: DC

## 2016-04-14 MED ORDER — PROPOFOL 10 MG/ML IV BOLUS
INTRAVENOUS | Status: DC | PRN
  Administered 2016-04-14: 160 mg via INTRAVENOUS

## 2016-04-14 MED ORDER — HEPARIN SODIUM (PORCINE) 5000 UNIT/ML IJ SOLN
5000.0000 [IU] | Freq: Once | INTRAMUSCULAR | Status: AC
  Administered 2016-04-14: 5000 [IU] via SUBCUTANEOUS

## 2016-04-14 MED ORDER — LIDOCAINE HCL (CARDIAC) 20 MG/ML IV SOLN
INTRAVENOUS | Status: DC | PRN
  Administered 2016-04-14: 60 mg via INTRAVENOUS

## 2016-04-14 MED ORDER — FAMOTIDINE 20 MG PO TABS
ORAL_TABLET | ORAL | Status: AC
  Filled 2016-04-14: qty 1

## 2016-04-14 MED ORDER — DEXAMETHASONE SODIUM PHOSPHATE 10 MG/ML IJ SOLN
INTRAMUSCULAR | Status: DC | PRN
  Administered 2016-04-14: 5 mg via INTRAVENOUS

## 2016-04-14 MED ORDER — FENTANYL CITRATE (PF) 100 MCG/2ML IJ SOLN
25.0000 ug | INTRAMUSCULAR | Status: DC | PRN
  Administered 2016-04-14: 50 ug via INTRAVENOUS
  Administered 2016-04-14 (×2): 25 ug via INTRAVENOUS

## 2016-04-14 MED ORDER — ACETAMINOPHEN 650 MG RE SUPP
650.0000 mg | RECTAL | Status: DC | PRN
  Filled 2016-04-14: qty 1

## 2016-04-14 MED ORDER — LACTATED RINGERS IV SOLN
INTRAVENOUS | Status: DC | PRN
  Administered 2016-04-14: 13:00:00 via INTRAVENOUS

## 2016-04-14 MED ORDER — SUCCINYLCHOLINE CHLORIDE 20 MG/ML IJ SOLN
INTRAMUSCULAR | Status: DC | PRN
  Administered 2016-04-14: 120 mg via INTRAVENOUS

## 2016-04-14 SURGICAL SUPPLY — 48 items
BAG URO DRAIN 2000ML W/SPOUT (MISCELLANEOUS) ×3 IMPLANT
BLADE SURG SZ11 CARB STEEL (BLADE) ×3 IMPLANT
CANISTER SUCT 1200ML W/VALVE (MISCELLANEOUS) ×3 IMPLANT
CATH FOLEY 2WAY  5CC 16FR (CATHETERS) ×2
CATH URTH 16FR FL 2W BLN LF (CATHETERS) ×1 IMPLANT
CHLORAPREP W/TINT 26ML (MISCELLANEOUS) ×6 IMPLANT
DEFOGGER SCOPE WARMER CLEARIFY (MISCELLANEOUS) ×3 IMPLANT
DEVICE SUTURE ENDOST 10MM (ENDOMECHANICALS) IMPLANT
DRAPE LEGGINS SURG 28X43 STRL (DRAPES) ×3 IMPLANT
DRAPE SHEET LG 3/4 BI-LAMINATE (DRAPES) ×3 IMPLANT
DRAPE UNDER BUTTOCK W/FLU (DRAPES) ×3 IMPLANT
GLOVE PI ORTHOPRO 6.5 (GLOVE) ×4
GLOVE PI ORTHOPRO STRL 6.5 (GLOVE) ×2 IMPLANT
GLOVE SURG SYN 6.5 ES PF (GLOVE) ×12 IMPLANT
GOWN STRL REUS W/ TWL LRG LVL3 (GOWN DISPOSABLE) ×3 IMPLANT
GOWN STRL REUS W/ TWL XL LVL3 (GOWN DISPOSABLE) ×1 IMPLANT
GOWN STRL REUS W/TWL LRG LVL3 (GOWN DISPOSABLE) ×6
GOWN STRL REUS W/TWL XL LVL3 (GOWN DISPOSABLE) ×2
HIBICLENS CHG 4% 32OZ (MISCELLANEOUS) ×3 IMPLANT
IRRIGATION STRYKERFLOW (MISCELLANEOUS) ×1 IMPLANT
IRRIGATOR STRYKERFLOW (MISCELLANEOUS) ×3
IV LACTATED RINGERS 1000ML (IV SOLUTION) ×3 IMPLANT
KIT PINK PAD W/HEAD ARE REST (MISCELLANEOUS) ×3
KIT PINK PAD W/HEAD ARM REST (MISCELLANEOUS) ×1 IMPLANT
KIT RM TURNOVER CYSTO AR (KITS) ×3 IMPLANT
L-HOOK LAP DISP 36CM (ELECTROSURGICAL)
LHOOK LAP DISP 36CM (ELECTROSURGICAL) IMPLANT
LIGASURE BLUNT 5MM 37CM (INSTRUMENTS) ×3 IMPLANT
LIQUID BAND (GAUZE/BANDAGES/DRESSINGS) ×3 IMPLANT
MANIPULATOR VCARE LG CRV RETR (MISCELLANEOUS) IMPLANT
MANIPULATOR VCARE SML CRV RETR (MISCELLANEOUS) IMPLANT
MANIPULATOR VCARE STD CRV RETR (MISCELLANEOUS) ×6 IMPLANT
NS IRRIG 500ML POUR BTL (IV SOLUTION) ×3 IMPLANT
PACK LAP CHOLECYSTECTOMY (MISCELLANEOUS) ×3 IMPLANT
PAD OB MATERNITY 4.3X12.25 (PERSONAL CARE ITEMS) ×3 IMPLANT
PAD PREP 24X41 OB/GYN DISP (PERSONAL CARE ITEMS) ×3 IMPLANT
PENCIL ELECTRO HAND CTR (MISCELLANEOUS) ×3 IMPLANT
SCISSORS METZENBAUM CVD 33 (INSTRUMENTS) ×3 IMPLANT
SET CYSTO W/LG BORE CLAMP LF (SET/KITS/TRAYS/PACK) IMPLANT
SLEEVE ENDOPATH XCEL 5M (ENDOMECHANICALS) ×3 IMPLANT
SPONGE XRAY 4X4 16PLY STRL (MISCELLANEOUS) ×3 IMPLANT
SURGILUBE 2OZ TUBE FLIPTOP (MISCELLANEOUS) ×3 IMPLANT
SUT ENDO VLOC 180-0-8IN (SUTURE) IMPLANT
SUT VIC AB 0 CT1 36 (SUTURE) ×9 IMPLANT
SUT VIC AB 2-0 UR6 27 (SUTURE) ×3 IMPLANT
TROCAR BLUNT TIP 12MM OMST12BT (TROCAR) ×3 IMPLANT
TROCAR XCEL NON-BLD 5MMX100MML (ENDOMECHANICALS) ×3 IMPLANT
TUBING INSUFFLATOR HEATED (MISCELLANEOUS) ×3 IMPLANT

## 2016-04-14 NOTE — Discharge Instructions (Signed)
Discharge instructions:  Call office if you have any of the following: fever >101 F, chills, excessive vaginal bleeding, incision drainage or problems, leg pain or redness, or any other concerns.   Activity: Do not lift > 10 lbs for 8 weeks.  No intercourse or tampons for 8 weeks.  No driving for 1-2 weeks.   Expect some pain in your right ribs/upper abdomen and right shoulder.  This is due to the air used during your surgery and will just take time to go away.  Be patient.   Please don't limit yourself in terms of routine activity.  You will be able to do most things, although they may take longer to do or be a little painful.  You can do it!  Don't be a hero, but don't be a wimp either!   AMBULATORY SURGERY  DISCHARGE INSTRUCTIONS   1) The drugs that you were given will stay in your system until tomorrow so for the next 24 hours you should not:  A) Drive an automobile B) Make any legal decisions C) Drink any alcoholic beverage   2) You may resume regular meals tomorrow.  Today it is better to start with liquids and gradually work up to solid foods.  You may eat anything you prefer, but it is better to start with liquids, then soup and crackers, and gradually work up to solid foods.   3) Please notify your doctor immediately if you have any unusual bleeding, trouble breathing, redness and pain at the surgery site, drainage, fever, or pain not relieved by medication.    4) Additional Instructions:        Please contact your physician with any problems or Same Day Surgery at [phone], Monday through Friday 6 am to 4 pm, or [place] at [facility] number at [phone].

## 2016-04-14 NOTE — Anesthesia Preprocedure Evaluation (Signed)
Anesthesia Evaluation  Patient identified by MRN, date of birth, ID band Patient awake    Reviewed: Allergy & Precautions, H&P , NPO status , Patient's Chart, lab work & pertinent test results  History of Anesthesia Complications Negative for: history of anesthetic complications  Airway Mallampati: II  TM Distance: >3 FB Neck ROM: full    Dental  (+) Poor Dentition   Pulmonary neg shortness of breath,    Pulmonary exam normal breath sounds clear to auscultation       Cardiovascular Exercise Tolerance: Good hypertension, (-) angina(-) Past MI and (-) DOE Normal cardiovascular exam+ dysrhythmias Supra Ventricular Tachycardia  Rhythm:regular Rate:Normal     Neuro/Psych PSYCHIATRIC DISORDERS Depression negative neurological ROS     GI/Hepatic negative GI ROS, Neg liver ROS, neg GERD  ,  Endo/Other  negative endocrine ROS  Renal/GU negative Renal ROS  negative genitourinary   Musculoskeletal   Abdominal   Peds  Hematology negative hematology ROS (+)   Anesthesia Other Findings Past Medical History:   Genital herpes                                               Abnormal finding on Pap smear                                SVT (supraventricular tachycardia) (HCC)                       Comment:hx   HTN (hypertension)                                           Dysrhythmia                                                  Infertility, female                                          Depression                                                     Comment:panic attacks  Past Surgical History:   APPENDECTOMY                                                    Comment:ruptured   cyst removed - ankle                                          DIAGNOSTIC LAPAROSCOPY  LABIAL ADHESION LYSIS                                         LAPAROSCOPIC OVARIAN CYSTECTOMY                 Left             ABLATION                                                        Comment:cardiac 2011   no followed by cardio   HYSTEROSCOPY W/D&C                              N/A 02/09/2016      Comment:Procedure: DILATATION AND CURETTAGE               /HYSTEROSCOPY;  Surgeon: Honor Loh Ward, MD;                Location: [facility] ORS;  Service: Gynecology;                Laterality: N/A;  BMI    Body Mass Index   33.71 kg/m 2      Reproductive/Obstetrics negative OB ROS                             Anesthesia Physical Anesthesia Plan  ASA: III  Anesthesia Plan: General ETT   Post-op Pain Management:    Induction:   Airway Management Planned:   Additional Equipment:   Intra-op Plan:   Post-operative Plan:   Informed Consent: I have reviewed the patients History and Physical, chart, labs and discussed the procedure including the risks, benefits and alternatives for the proposed anesthesia with the patient or authorized representative who has indicated his/her understanding and acceptance.   Dental Advisory Given  Plan Discussed with: Anesthesiologist, CRNA and Surgeon  Anesthesia Plan Comments:         Anesthesia Quick Evaluation

## 2016-04-14 NOTE — Anesthesia Postprocedure Evaluation (Signed)
Anesthesia Post Note  Patient: Nicole Avila  Procedure(s) Performed: Procedure(s) (LRB):  TOTAL LAPAROSCOPIC HYSTERECTOMY (Bilateral)  Patient location during evaluation: PACU Anesthesia Type: General Level of consciousness: awake and alert Pain management: pain level controlled Vital Signs Assessment: post-procedure vital signs reviewed and stable Respiratory status: spontaneous breathing, nonlabored ventilation, respiratory function stable and patient connected to nasal cannula oxygen Cardiovascular status: blood pressure returned to baseline and stable Postop Assessment: no signs of nausea or vomiting Anesthetic complications: no    Last Vitals:  Filed Vitals:   04/14/16 1528 04/14/16 1554  BP: 135/78 127/73  Pulse: 76 74  Temp: 37.1 C   Resp: 21 16    Last Pain:  Filed Vitals:   04/14/16 1555  PainSc: 3                  Precious Haws Aldrich Lloyd

## 2016-04-14 NOTE — Transfer of Care (Signed)
Immediate Anesthesia Transfer of Care Note  Patient: Nicole Avila  Procedure(s) Performed: Procedure(s):  TOTAL LAPAROSCOPIC HYSTERECTOMY (Bilateral)  Patient Location: PACU  Anesthesia Type:General  Level of Consciousness: awake and patient cooperative  Airway & Oxygen Therapy: Patient Spontanous Breathing and Patient connected to face mask oxygen  Post-op Assessment: Report given to RN  Post vital signs: Reviewed and stable  Last Vitals:  Filed Vitals:   04/14/16 1114 04/14/16 1445  BP: 154/82 139/77  Pulse: 84 69  Temp: 36.2 C 36.2 C  Resp: 16 16  139/77  Last Pain:  Filed Vitals:   04/14/16 1445  PainSc: 4          Complications: No apparent anesthesia complications

## 2016-04-14 NOTE — H&P (Signed)
H&P Update  PLEASE SEE PAPER H&P  Pt was last seen in my office, and complete history and physical performed.  The surgical history has been reviewed and remains accurate without interval change. The patient was re-examined and patient's physiologic condition has not changed significantly in the last 30 days.  No new pharmacological allergies or types of therapy has been initiated.  No Known Allergies  Past Medical History  Diagnosis Date  . Genital herpes   . Abnormal finding on Pap smear   . SVT (supraventricular tachycardia) (HCC)     hx  . HTN (hypertension)   . Dysrhythmia   . Infertility, female   . Depression     panic attacks   Past Surgical History  Procedure Laterality Date  . Appendectomy      ruptured  . Cyst removed - ankle    . Diagnostic laparoscopy    . Labial adhesion lysis    . Laparoscopic ovarian cystectomy Left   . Ablation      cardiac 2011   no followed by cardio  . Hysteroscopy w/d&c N/A 02/09/2016    Procedure: DILATATION AND CURETTAGE /HYSTEROSCOPY;  Surgeon: Honor Loh Ivory Maduro, MD;  Location: [facility] ORS;  Service: Gynecology;  Laterality: N/A;    BP 154/82 mmHg  Pulse 84  Temp(Src) 97.2 F (36.2 C) (Oral)  Resp 16  Ht 5\' 10"  (1.778 m)  Wt 106.595 kg (235 lb)  BMI 33.72 kg/m2  SpO2 98%  LMP 03/13/2016  NAD RRR no murmurs CTAB, no wheezing, resps unlabored +BS, soft, NTTP No c/c/e Pelvic exam deferred  The above history was confirmed with the patient. The condition still exists that makes this procedure necessary. Surgical plan includes Total laparoscopic hysterectomy, bilateral salpingectomy, possible open, as confirmed on the consent. The treatment plan remains the same, without new options for care.  The patient understands the potential benefits and risks and the consents have been signed and placed on the chart.     Larey Days, MD Attending Obstetrician Gynecologist [facility]

## 2016-04-14 NOTE — Anesthesia Procedure Notes (Signed)
Procedure Name: Intubation Date/Time: 04/14/2016 12:42 PM Performed by: Dionne Bucy Pre-anesthesia Checklist: Patient identified, Patient being monitored, Timeout performed, Emergency Drugs available and Suction available Patient Re-evaluated:Patient Re-evaluated prior to inductionOxygen Delivery Method: Circle system utilized Preoxygenation: Pre-oxygenation with 100% oxygen Intubation Type: IV induction Ventilation: Mask ventilation without difficulty Laryngoscope Size: Mac and 3 Grade View: Grade I Tube type: Oral Tube size: 7.0 mm Number of attempts: 1 Airway Equipment and Method: Stylet Placement Confirmation: ETT inserted through vocal cords under direct vision,  positive ETCO2 and breath sounds checked- equal and bilateral Secured at: 21 cm Tube secured with: Tape Dental Injury: Teeth and Oropharynx as per pre-operative assessment

## 2016-04-18 LAB — SURGICAL PATHOLOGY

## 2016-04-21 NOTE — Op Note (Signed)
Total Laparoscopic Hysterectomy Operative Note Procedure Date: 04/14/2016  Patient:  Nicole Avila  45 y.o. female  PRE-OPERATIVE DIAGNOSIS:  ABNORMAL UTERINE BLEEDING, history of ruptured appendix and intra-abdominal infection  POST-OPERATIVE DIAGNOSIS:  Same  PROCEDURE:  Procedure(s):  TOTAL LAPAROSCOPIC HYSTERECTOMY (Bilateral)  SURGEON:  Surgeon(s) and Role:    * Honor Loh Ward, MD - Primary    * Will Bonnet, MD - Assisting  ANESTHESIA:  General via ET  I/O   800cc crystalloid, 50cc EBL  FINDINGS:   Small uterus, with bladder scarring to anterior surface.  Bowel adherent to upper abdominal wall at site of previous laparotomy.  Bilateral fimbriated ends of fallopian tubes densely adherent to ovaries.  Filmy adhesions in dome of diaphragm to right liver surface Rochele Raring)  SPECIMEN: Uterus, Cervix  COMPLICATIONS: none apparent  DISPOSITION: vital signs stable to PACU  Indication for Surgery: 45 y.o. G0 who had a history of a ruptured appendix at age 50 with subsequent intra-abdominal infection who presented to me with months of uterine bleeding.  She was found to have an intra-cavitary polyp, for which she had a D&C in 1/17.  Since then she started her period again and once again there was no resolution.  She was started on progesterone therapy to attempt to stop the bleeding, and requested hysterectomy for definitive management.   Risks of surgery were discussed with the patient including but not limited to: bleeding which may require transfusion or reoperation; infection which may require antibiotics; injury to bowel, bladder, ureters or other surrounding organs; need for additional procedures including laparotomy, blood clot, incisional problems and other postoperative/anesthesia complications. Written informed consent was obtained.      PROCEDURE IN DETAIL:  The patient had 5000u Heparin Sub-q and sequential compression devices applied to her lower extremities  while in the preoperative area.  She was then taken to the operating room where antibiotics were given, and general anesthesia was administered via endotracheal route.  She was placed in the dorsal lithotomy position, and was prepped and draped in a sterile manner. A surgical time-out was performed.  A Foley catheter was inserted into her bladder and attached to constant drainage and a V-Care uterine manipulator was then advanced into the uterus and a good fit around the cervix was noted. The gloves were changed, and attention was turned to the abdomen where an umbilical incision was made with the scalpel. A 65mm trochar was inserted in the umbilical incision using a visiport method, and a pneumoperitoneum was created to 39mmHg. A survey of the patient's pelvis and abdomen revealed the findings as mentioned above. A 10mm port was placed in the left lower quadrant and the adhesions in the RLQ were taken down with both cautery and cold scissors in order to accommodate the second 76mm port.  The adhesions were not removed in their entirety.    The bilateral fallopian tubes were unable to be separated from the ovaries.  They were divided at each cornua by the Ligasure. The bilateral round ligaments were transected and anterior broad ligament divided and brought across the uterus to separate the vesicouterine peritoneum and create a bladder flap. The bladder was pushed away from the uterus. The bilateral uterine arteries were ligated and transected. The bilateral uterosacral and cardinal ligaments were ligated and transected. A colpotomy was made around the V-Care cervical cup and the uterus and cervix were removed through the vagina. The vaginal cuff was closed vaginally using 0-Vicryl in a running locking stitch. This was  tested for integrity using the surgeon's finger. After a change of gloves, the pneumoperitoneum was recreated and surgical site inspected, and found to be hemostatic. Bilateral ureters were  visualized vermiuclating. No intraoperative injury to surrounding organs was noted. The abdomen was desufflated and all instruments were then removed.   All skin incisions were closed with 4-0 monocryl and covered with surgical glue. The patient tolerated the procedures well.  All instruments, needles, and sponge counts were correct x 2. The patient was taken to the recovery room in stable condition.   ---- Larey Days, MD Attending Obstetrician and Gynecologist [facility]

## 2017-06-19 ENCOUNTER — Other Ambulatory Visit: Payer: Self-pay | Admitting: Orthopedic Surgery

## 2017-06-19 DIAGNOSIS — M25561 Pain in right knee: Principal | ICD-10-CM

## 2017-06-19 DIAGNOSIS — G8929 Other chronic pain: Secondary | ICD-10-CM

## 2017-06-28 ENCOUNTER — Ambulatory Visit
Admission: RE | Admit: 2017-06-28 | Discharge: 2017-06-28 | Disposition: A | Discharge status: Home / Self Care | Payer: BLUE CROSS/BLUE SHIELD | Source: Ambulatory Visit | Attending: Orthopedic Surgery | Admitting: Orthopedic Surgery

## 2017-06-28 DIAGNOSIS — X58XXXA Exposure to other specified factors, initial encounter: Secondary | ICD-10-CM | POA: Diagnosis not present

## 2017-06-28 DIAGNOSIS — M25561 Pain in right knee: Secondary | ICD-10-CM | POA: Diagnosis not present

## 2017-06-28 DIAGNOSIS — S83241A Other tear of medial meniscus, current injury, right knee, initial encounter: Secondary | ICD-10-CM | POA: Insufficient documentation

## 2017-06-28 DIAGNOSIS — M1711 Unilateral primary osteoarthritis, right knee: Secondary | ICD-10-CM | POA: Insufficient documentation

## 2017-06-28 DIAGNOSIS — M25461 Effusion, right knee: Secondary | ICD-10-CM | POA: Insufficient documentation

## 2017-06-28 DIAGNOSIS — G8929 Other chronic pain: Secondary | ICD-10-CM | POA: Diagnosis not present

## 2017-07-31 ENCOUNTER — Encounter: Payer: Self-pay | Admitting: *Deleted

## 2017-08-09 ENCOUNTER — Ambulatory Visit: Payer: BLUE CROSS/BLUE SHIELD | Admitting: Anesthesiology

## 2017-08-09 ENCOUNTER — Encounter
Admission: RE | Disposition: A | Discharge status: Home / Self Care | Payer: Self-pay | Source: Ambulatory Visit | Attending: Surgery

## 2017-08-09 ENCOUNTER — Ambulatory Visit
Admission: RE | Admit: 2017-08-09 | Discharge: 2017-08-09 | Disposition: A | Discharge status: Home / Self Care | Payer: BLUE CROSS/BLUE SHIELD | Source: Ambulatory Visit | Attending: Surgery | Admitting: Surgery

## 2017-08-09 DIAGNOSIS — S83281A Other tear of lateral meniscus, current injury, right knee, initial encounter: Secondary | ICD-10-CM | POA: Insufficient documentation

## 2017-08-09 DIAGNOSIS — X58XXXA Exposure to other specified factors, initial encounter: Secondary | ICD-10-CM | POA: Insufficient documentation

## 2017-08-09 DIAGNOSIS — Y929 Unspecified place or not applicable: Secondary | ICD-10-CM | POA: Insufficient documentation

## 2017-08-09 DIAGNOSIS — S83241A Other tear of medial meniscus, current injury, right knee, initial encounter: Secondary | ICD-10-CM | POA: Diagnosis not present

## 2017-08-09 DIAGNOSIS — I10 Essential (primary) hypertension: Secondary | ICD-10-CM | POA: Diagnosis not present

## 2017-08-09 DIAGNOSIS — M1711 Unilateral primary osteoarthritis, right knee: Secondary | ICD-10-CM | POA: Diagnosis not present

## 2017-08-09 DIAGNOSIS — Z79899 Other long term (current) drug therapy: Secondary | ICD-10-CM | POA: Insufficient documentation

## 2017-08-09 DIAGNOSIS — M94261 Chondromalacia, right knee: Secondary | ICD-10-CM | POA: Insufficient documentation

## 2017-08-09 DIAGNOSIS — E785 Hyperlipidemia, unspecified: Secondary | ICD-10-CM | POA: Insufficient documentation

## 2017-08-09 DIAGNOSIS — Y939 Activity, unspecified: Secondary | ICD-10-CM | POA: Insufficient documentation

## 2017-08-09 HISTORY — DX: Motion sickness, initial encounter: T75.3XXA

## 2017-08-09 HISTORY — PX: KNEE ARTHROSCOPY: SHX127

## 2017-08-09 HISTORY — DX: Unspecified osteoarthritis, unspecified site: M19.90

## 2017-08-09 SURGERY — ARTHROSCOPY, KNEE
Anesthesia: General | Laterality: Right

## 2017-08-09 MED ORDER — ONDANSETRON HCL 4 MG/2ML IJ SOLN
INTRAMUSCULAR | Status: DC | PRN
  Administered 2017-08-09: 4 mg via INTRAVENOUS

## 2017-08-09 MED ORDER — PROMETHAZINE HCL 25 MG/ML IJ SOLN: 6.2500 mg | INTRAMUSCULAR | Status: DC | PRN

## 2017-08-09 MED ORDER — HYDROCODONE-ACETAMINOPHEN 5-325 MG PO TABS: 1.0000 | ORAL_TABLET | Freq: Four times a day (QID) | ORAL | 0 refills | Status: DC | PRN

## 2017-08-09 MED ORDER — FENTANYL CITRATE (PF) 100 MCG/2ML IJ SOLN
INTRAMUSCULAR | Status: DC | PRN
  Administered 2017-08-09: 100 ug via INTRAVENOUS

## 2017-08-09 MED ORDER — ONDANSETRON HCL 4 MG PO TABS: 4.0000 mg | ORAL_TABLET | Freq: Four times a day (QID) | ORAL | Status: DC | PRN

## 2017-08-09 MED ORDER — DEXAMETHASONE SODIUM PHOSPHATE 4 MG/ML IJ SOLN
INTRAMUSCULAR | Status: DC | PRN
  Administered 2017-08-09: 4 mg via INTRAVENOUS

## 2017-08-09 MED ORDER — POTASSIUM CHLORIDE IN NACL 20-0.9 MEQ/L-% IV SOLN: INTRAVENOUS | Status: DC

## 2017-08-09 MED ORDER — LACTATED RINGERS IV SOLN
10.0000 mL/h | INTRAVENOUS | Status: DC
  Administered 2017-08-09: 10 mL/h via INTRAVENOUS

## 2017-08-09 MED ORDER — BUPIVACAINE-EPINEPHRINE (PF) 0.5% -1:200000 IJ SOLN
INTRAMUSCULAR | Status: DC | PRN
  Administered 2017-08-09: 20 mL via PERINEURAL

## 2017-08-09 MED ORDER — LIDOCAINE HCL 1 % IJ SOLN
INTRAMUSCULAR | Status: DC | PRN
  Administered 2017-08-09: 60 mL via INTRAMUSCULAR

## 2017-08-09 MED ORDER — METOCLOPRAMIDE HCL 5 MG/ML IJ SOLN: 5.0000 mg | Freq: Three times a day (TID) | INTRAMUSCULAR | Status: DC | PRN

## 2017-08-09 MED ORDER — FENTANYL CITRATE (PF) 100 MCG/2ML IJ SOLN
25.0000 ug | INTRAMUSCULAR | Status: DC | PRN
  Administered 2017-08-09 (×2): 50 ug via INTRAVENOUS
  Administered 2017-08-09 (×2): 25 ug via INTRAVENOUS

## 2017-08-09 MED ORDER — ONDANSETRON HCL 4 MG/2ML IJ SOLN: 4.0000 mg | Freq: Four times a day (QID) | INTRAMUSCULAR | Status: DC | PRN

## 2017-08-09 MED ORDER — PROPOFOL 10 MG/ML IV BOLUS
INTRAVENOUS | Status: DC | PRN
  Administered 2017-08-09: 200 mg via INTRAVENOUS

## 2017-08-09 MED ORDER — OXYCODONE HCL 5 MG PO TABS
5.0000 mg | ORAL_TABLET | Freq: Once | ORAL | Status: AC | PRN
  Administered 2017-08-09: 5 mg via ORAL

## 2017-08-09 MED ORDER — MIDAZOLAM HCL 5 MG/5ML IJ SOLN
INTRAMUSCULAR | Status: DC | PRN
  Administered 2017-08-09: 2 mg via INTRAVENOUS

## 2017-08-09 MED ORDER — LIDOCAINE HCL (CARDIAC) 20 MG/ML IV SOLN
INTRAVENOUS | Status: DC | PRN
  Administered 2017-08-09: 50 mg via INTRATRACHEAL

## 2017-08-09 MED ORDER — MEPERIDINE HCL 25 MG/ML IJ SOLN: 6.2500 mg | INTRAMUSCULAR | Status: DC | PRN

## 2017-08-09 MED ORDER — HYDROCODONE-ACETAMINOPHEN 5-325 MG PO TABS: 1.0000 | ORAL_TABLET | ORAL | Status: DC | PRN

## 2017-08-09 MED ORDER — METOCLOPRAMIDE HCL 5 MG PO TABS: 5.0000 mg | ORAL_TABLET | Freq: Three times a day (TID) | ORAL | Status: DC | PRN

## 2017-08-09 MED ORDER — CEFAZOLIN SODIUM-DEXTROSE 2-4 GM/100ML-% IV SOLN
2.0000 g | Freq: Once | INTRAVENOUS | Status: AC
  Administered 2017-08-09: 2 g via INTRAVENOUS

## 2017-08-09 MED ORDER — OXYCODONE HCL 5 MG/5ML PO SOLN: 5.0000 mg | Freq: Once | ORAL | Status: AC | PRN

## 2017-08-09 MED ORDER — GLYCOPYRROLATE 0.2 MG/ML IJ SOLN
INTRAMUSCULAR | Status: DC | PRN
  Administered 2017-08-09: .1 mg via INTRAVENOUS

## 2017-08-09 SURGICAL SUPPLY — 35 items
BANDAGE ACE 6X5 VEL STRL LF (GAUZE/BANDAGES/DRESSINGS) ×2 IMPLANT
BANDAGE ELASTIC 6 LF NS (GAUZE/BANDAGES/DRESSINGS) ×2 IMPLANT
BLADE FULL RADIUS 3.5 (BLADE) ×2 IMPLANT
BUR ACROMIONIZER 4.0 (BURR) IMPLANT
CHLORAPREP W/TINT 26ML (MISCELLANEOUS) ×2 IMPLANT
COVER LIGHT HANDLE UNIVERSAL (MISCELLANEOUS) ×4 IMPLANT
CUFF TOURN SGL QUICK 30 (MISCELLANEOUS)
CUFF TOURN SGL QUICK 34 (TOURNIQUET CUFF)
CUFF TRNQT CYL 34X4X40X1 (TOURNIQUET CUFF) IMPLANT
CUFF TRNQT CYL LO 30X4X (MISCELLANEOUS) IMPLANT
DRAPE IMP U-DRAPE 54X76 (DRAPES) ×2 IMPLANT
FASTFIX NDL DEL SYS 360 CVD (Miscellaneous) ×6 IMPLANT
GAUZE PETROLATUM 1 X8 (GAUZE/BANDAGES/DRESSINGS) ×2 IMPLANT
GAUZE SPONGE 4X4 12PLY STRL (GAUZE/BANDAGES/DRESSINGS) ×2 IMPLANT
GLOVE BIO SURGEON STRL SZ8 (GLOVE) ×4 IMPLANT
GLOVE INDICATOR 8.0 STRL GRN (GLOVE) ×2 IMPLANT
GOWN STRL REUS W/ TWL LRG LVL3 (GOWN DISPOSABLE) ×1 IMPLANT
GOWN STRL REUS W/ TWL XL LVL3 (GOWN DISPOSABLE) ×1 IMPLANT
GOWN STRL REUS W/TWL LRG LVL3 (GOWN DISPOSABLE) ×1
GOWN STRL REUS W/TWL XL LVL3 (GOWN DISPOSABLE) ×1
IV LACTATED RINGER IRRG 3000ML (IV SOLUTION) ×2
IV LR IRRIG 3000ML ARTHROMATIC (IV SOLUTION) ×2 IMPLANT
KIT ROOM TURNOVER OR (KITS) ×2 IMPLANT
MANIFOLD 4PT FOR NEPTUNE1 (MISCELLANEOUS) ×2 IMPLANT
NEEDLE HYPO 21X1.5 SAFETY (NEEDLE) ×4 IMPLANT
PACK ARTHROSCOPY KNEE (MISCELLANEOUS) ×2 IMPLANT
PUSHER KNOT ARTHRO STRT FASTFI (MISCELLANEOUS) ×2 IMPLANT
STRAP BODY AND KNEE 60X3 (MISCELLANEOUS) ×2 IMPLANT
SUT PROLENE 4 0 PS 2 18 (SUTURE) ×2 IMPLANT
SUT VIC AB 2-0 CT1 27 (SUTURE)
SUT VIC AB 2-0 CT1 TAPERPNT 27 (SUTURE) IMPLANT
SYR 50ML LL SCALE MARK (SYRINGE) ×2 IMPLANT
SYSTEM NDL DEL FSTFX  360 CVD (Miscellaneous) ×3 IMPLANT
TUBING ARTHRO INFLOW-ONLY STRL (TUBING) ×2 IMPLANT
WAND HAND CNTRL MULTIVAC 90 (MISCELLANEOUS) IMPLANT

## 2017-08-09 NOTE — Transfer of Care (Signed)
Immediate Anesthesia Transfer of Care Note  Patient: Nicole Avila  Procedure(s) Performed: Procedure(s): RIGHT ARTHROSCOPY KNEE WITH DEBRIDEMENT, PARTIAL MEDIAL MENISCECTOMY AND ABRASION CHONDROPLASTY (Right)  Patient Location: PACU  Anesthesia Type: General LMA  Level of Consciousness: awake, alert  and patient cooperative  Airway and Oxygen Therapy: Patient Spontanous Breathing and Patient connected to supplemental oxygen  Post-op Assessment: Post-op Vital signs reviewed, Patient's Cardiovascular Status Stable, Respiratory Function Stable, Patent Airway and No signs of Nausea or vomiting  Post-op Vital Signs: Reviewed and stable  Complications: No apparent anesthesia complications

## 2017-08-09 NOTE — Anesthesia Postprocedure Evaluation (Signed)
Anesthesia Post Note  Patient: Scientist, forensic  Procedure(s) Performed: Procedure(s) (LRB): RIGHT ARTHROSCOPY KNEE WITH DEBRIDEMENT, PARTIAL MEDIAL MENISCECTOMY AND ABRASION CHONDROPLASTY (Right)  Patient location during evaluation: PACU Anesthesia Type: General Level of consciousness: awake and alert Pain management: pain level controlled Vital Signs Assessment: post-procedure vital signs reviewed and stable Respiratory status: spontaneous breathing, nonlabored ventilation, respiratory function stable and patient connected to nasal cannula oxygen Cardiovascular status: blood pressure returned to baseline and stable Postop Assessment: no signs of nausea or vomiting Anesthetic complications: no    SCOURAS, NICOLE ELAINE

## 2017-08-09 NOTE — H&P (Signed)
Paper H&P to be scanned into permanent record. H&P reviewed and patient re-examined. No changes. 

## 2017-08-09 NOTE — Anesthesia Preprocedure Evaluation (Signed)
Anesthesia Evaluation  Patient identified by MRN, date of birth, ID band Patient awake    Reviewed: Allergy & Precautions, H&P , NPO status , Patient's Chart, lab work & pertinent test results  History of Anesthesia Complications Negative for: history of anesthetic complications  Airway Mallampati: II  TM Distance: >3 FB Neck ROM: full    Dental  (+) Poor Dentition   Pulmonary neg shortness of breath,    Pulmonary exam normal breath sounds clear to auscultation       Cardiovascular Exercise Tolerance: Good hypertension, (-) angina(-) Past MI and (-) DOE Normal cardiovascular exam+ dysrhythmias Supra Ventricular Tachycardia  Rhythm:regular Rate:Normal     Neuro/Psych PSYCHIATRIC DISORDERS Depression negative neurological ROS     GI/Hepatic negative GI ROS, Neg liver ROS, neg GERD  ,  Endo/Other  negative endocrine ROS  Renal/GU negative Renal ROS  negative genitourinary   Musculoskeletal  (+) Arthritis ,   Abdominal   Peds  Hematology negative hematology ROS (+)   Anesthesia Other Findings Past Medical History:   Genital herpes                                               Abnormal finding on Pap smear                                SVT (supraventricular tachycardia) (HCC)                       Comment:hx   HTN (hypertension)                                           Dysrhythmia                                                  Infertility, female                                          Depression                                                     Comment:panic attacks  Past Surgical History:   APPENDECTOMY                                                    Comment:ruptured   cyst removed - ankle                                          DIAGNOSTIC LAPAROSCOPY  LABIAL ADHESION LYSIS                                         LAPAROSCOPIC OVARIAN CYSTECTOMY                  Left              ABLATION                                                        Comment:cardiac 2011   no followed by cardio   HYSTEROSCOPY W/D&C                              N/A 02/09/2016      Comment:Procedure: DILATATION AND CURETTAGE               /HYSTEROSCOPY;  Surgeon: Honor Loh Ward, MD;                Location: [facility] ORS;  Service: Gynecology;                Laterality: N/A;  BMI    Body Mass Index   33.71 kg/m 2      Reproductive/Obstetrics negative OB ROS                             Anesthesia Physical  Anesthesia Plan  ASA: II  Anesthesia Plan: General LMA   Post-op Pain Management:    Induction:   PONV Risk Score and Plan:   Airway Management Planned:   Additional Equipment:   Intra-op Plan:   Post-operative Plan:   Informed Consent: I have reviewed the patients History and Physical, chart, labs and discussed the procedure including the risks, benefits and alternatives for the proposed anesthesia with the patient or authorized representative who has indicated his/her understanding and acceptance.   Dental Advisory Given  Plan Discussed with: Anesthesiologist, CRNA and Surgeon  Anesthesia Plan Comments:         Anesthesia Quick Evaluation

## 2017-08-09 NOTE — Discharge Instructions (Addendum)
General Anesthesia, Adult, Care After These instructions provide you with information about caring for yourself after your procedure. Your health care provider may also give you more specific instructions. Your treatment has been planned according to current medical practices, but problems sometimes occur. Call your health care provider if you have any problems or questions after your procedure. What can I expect after the procedure? After the procedure, it is common to have:  Vomiting.  A sore throat.  Mental slowness.  It is common to feel:  Nauseous.  Cold or shivery.  Sleepy.  Tired.  Sore or achy, even in parts of your body where you did not have surgery.  Follow these instructions at home: For at least 24 hours after the procedure:  Do not: ? Participate in activities where you could fall or become injured. ? Drive. ? Use heavy machinery. ? Drink alcohol. ? Take sleeping pills or medicines that cause drowsiness. ? Make important decisions or sign legal documents. ? Take care of children on your own.  Rest. Eating and drinking  If you vomit, drink water, juice, or soup when you can drink without vomiting.  Drink enough fluid to keep your urine clear or pale yellow.  Make sure you have little or no nausea before eating solid foods.  Follow the diet recommended by your health care provider. General instructions  Have a responsible adult stay with you until you are awake and alert.  Return to your normal activities as told by your health care provider. Ask your health care provider what activities are safe for you.  Take over-the-counter and prescription medicines only as told by your health care provider.  If you smoke, do not smoke without supervision.  Keep all follow-up visits as told by your health care provider. This is important. Contact a health care provider if:  You continue to have nausea or vomiting at home, and medicines are not helpful.  You  cannot drink fluids or start eating again.  You cannot urinate after 8-12 hours.  You develop a skin rash.  You have fever.  You have increasing redness at the site of your procedure. Get help right away if:  You have difficulty breathing.  You have chest pain.  You have unexpected bleeding.  You feel that you are having a life-threatening or urgent problem. This information is not intended to replace advice given to you by your health care provider. Make sure you discuss any questions you have with your health care provider. Document Released: 03/13/2001 Document Revised: 05/09/2016 Document Reviewed: 11/19/2015 Elsevier Interactive Patient Education  2018 [employer].  Keep dressing dry and intact.  May shower after dressing changed on post-op day #4 (Sunday).  Cover sutures with Band-Aids after drying off. Apply ice frequently to knee. Resume Mobic 15 mg daily OR take ibuprofen 600-800 mg TID with meals for 7-10 days, then as necessary. Take pain medication as prescribed or ES Tylenol when needed.  May weight-bear as tolerated - use crutches or walker as needed. Follow-up in 10-14 days or as scheduled.

## 2017-08-09 NOTE — Op Note (Addendum)
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  Postoperative diagnosis:   Degenerative joint disease with medial and lateral meniscus tears and cartilaginous loose bodies, right knee.  Procedure:   Arthroscopic medial meniscus repair, arthroscopic partial lateral meniscectomy, excision of loose bodies, and abrasion chondroplasty of grade 3-4 chondromalacial changes of all compartments, right knee.  Surgeon:   Pascal Lux, M.D.  Anesthesia:   General LMA.  Findings:   As above. There were extensive grade 3 chondromalacial changes involving the medial femoral condyle, the lateral tibial plateau, the lateral femoral condyle, and grade 2 chondromalacial changes involving the medial tibial plateau, the patella, and the femoral trochlea. The anterior and posterior cruciate ligament both were in excellent condition.  Complications:   None.  EBL:   10 cc.  Total fluids:   850 cc of crystalloid.  Tourniquet time:   None  Drains:   None  Closure:   4-0 Prolene interrupted sutures.  Brief clinical note:   The patient is a 46 year old female with a history of right knee pain. Her symptoms have progressed despite medications, activity modification, etc. Her history and examination are consistent with a possible medial meniscus tear. An MRI scan demonstrated a radial tear of the posterior horn of the medial meniscus with moderate tricompartmental degenerative changes. The patient presents at this time for arthroscopy, debridement, and repair versus partial medial meniscectomy.  Procedure:   The patient was brought into the operating room and lain in the supine position. After adequate general laryngeal mask anesthesia was obtained, a timeout was performed to verify the appropriate side. The patient's right knee was injected sterilely using a solution of 30 cc of 1% lidocaine and 30 cc of 0.5% Sensorcaine  with epinephrine. The right lower extremity was prepped with ChloraPrep solution before being draped sterilely. Preoperative antibiotics were administered. The expected portal sites were injected with 0.5% Sensorcaine with epinephrine before the camera was placed in the anterolateral portal and instrumentation performed through the anteromedial portal. The knee was sequentially examined beginning in the suprapatellar pouch, then progressing to the patellofemoral space, the medial gutter compartment, the notch, and finally the lateral compartment and gutter. The findings were as described above. Abundant reactive synovial tissues anteriorly were debrided using the full-radius resector in order to improve visualization. During the debridement, several cartilaginous loose bodies were encountered and removed using the full-radius resector. Areas of degenerative tearing of the central mid and posterior portions of the lateral meniscus were debrided back to stable margins using the full-radius resector. In addition, abrasion chondroplasty was performed on the medial femoral condyle, lateral femoral condyle, the femoral trochlea, and the lateral tibial plateau. Finally, the posterior horn the medial meniscus was stabilized using a total of three [employer] FasT-Fix 360 meniscal repair devices. Subsequent probing of the repair demonstrated good stability. The instruments were removed from the joint after suctioning the excess fluid. The portal sites were closed using 4-0 Prolene interrupted sutures before a sterile bulky dressing was applied to the knee. The patient was then awakened, extubated, and returned to the recovery room in satisfactory condition after tolerating the procedure well.

## 2017-08-09 NOTE — Anesthesia Procedure Notes (Signed)
Procedure Name: LMA Insertion Date/Time: 08/09/2017 12:50 PM Performed by: Mayme Genta Pre-anesthesia Checklist: Patient identified, Emergency Drugs available, Suction available, Timeout performed and Patient being monitored Patient Re-evaluated:Patient Re-evaluated prior to induction Oxygen Delivery Method: Circle system utilized Preoxygenation: Pre-oxygenation with 100% oxygen Induction Type: IV induction LMA: LMA inserted LMA Size: 4.0 Number of attempts: 1 Placement Confirmation: positive ETCO2 and breath sounds checked- equal and bilateral Tube secured with: Tape

## 2017-08-10 ENCOUNTER — Encounter: Payer: Self-pay | Admitting: Surgery

## 2017-08-22 ENCOUNTER — Other Ambulatory Visit: Payer: Self-pay | Admitting: Obstetrics & Gynecology

## 2017-08-22 DIAGNOSIS — Z1231 Encounter for screening mammogram for malignant neoplasm of breast: Secondary | ICD-10-CM

## 2017-08-28 ENCOUNTER — Ambulatory Visit
Admission: RE | Admit: 2017-08-28 | Discharge: 2017-08-28 | Disposition: A | Discharge status: Home / Self Care | Payer: BLUE CROSS/BLUE SHIELD | Source: Ambulatory Visit | Attending: Obstetrics & Gynecology | Admitting: Obstetrics & Gynecology

## 2017-08-28 DIAGNOSIS — Z1231 Encounter for screening mammogram for malignant neoplasm of breast: Secondary | ICD-10-CM | POA: Insufficient documentation

## 2018-08-30 ENCOUNTER — Other Ambulatory Visit: Payer: Self-pay | Admitting: Obstetrics & Gynecology

## 2018-08-30 DIAGNOSIS — Z1231 Encounter for screening mammogram for malignant neoplasm of breast: Secondary | ICD-10-CM

## 2018-09-10 ENCOUNTER — Ambulatory Visit
Admission: RE | Admit: 2018-09-10 | Discharge: 2018-09-10 | Disposition: A | Discharge status: Home / Self Care | Payer: BLUE CROSS/BLUE SHIELD | Source: Ambulatory Visit | Attending: Obstetrics & Gynecology | Admitting: Obstetrics & Gynecology

## 2018-09-10 DIAGNOSIS — Z1231 Encounter for screening mammogram for malignant neoplasm of breast: Secondary | ICD-10-CM | POA: Insufficient documentation

## 2019-01-28 ENCOUNTER — Encounter: Payer: Self-pay | Admitting: Podiatry

## 2019-01-28 ENCOUNTER — Ambulatory Visit (INDEPENDENT_AMBULATORY_CARE_PROVIDER_SITE_OTHER): Payer: BLUE CROSS/BLUE SHIELD | Admitting: Podiatry

## 2019-01-28 VITALS — BP 184/86 | HR 74

## 2019-01-28 DIAGNOSIS — W450XXA Nail entering through skin, initial encounter: Secondary | ICD-10-CM

## 2019-01-28 DIAGNOSIS — L603 Nail dystrophy: Secondary | ICD-10-CM

## 2019-01-28 NOTE — Progress Notes (Signed)
This patient presents the office with chief complaint of her toenail on her left big toe becoming unattached from the nailbed.  She says this is been unattached for approximately 7 days.  She says that she has not experienced any trauma or injury to the toenail.  She says there is been no evidence of any drainage from the toenail.  She presents the office today for an evaluation and treatment of this unattached nail plate left foot.  General Appearance  Alert, conversant and in no acute stress.  Vascular  Dorsalis pedis and posterior tibial  pulses are palpable  bilaterally.  Capillary return is within normal limits  bilaterally. Temperature is within normal limits  bilaterally.  Neurologic  Senn-Weinstein monofilament wire test within normal limits  bilaterally. Muscle power within normal limits bilaterally.  Nails Normal nails noted x 9.  The left hallux nail plate is unattached.  No redness or swelling or drainage.  Orthopedic  No limitations of motion  feet .  No crepitus or effusions noted.  No bony pathology or digital deformities noted.  Skin  normotropic skin with no porokeratosis noted bilaterally.  No signs of infections or ulcers noted.    Nail Injur left hallux.  Unattached nail plate left hallux.  IE  Debride nail plate left hallux.  RTC prn.  Gardiner Barefoot DPM

## 2019-09-04 ENCOUNTER — Other Ambulatory Visit: Payer: Self-pay | Admitting: Obstetrics & Gynecology

## 2019-09-04 DIAGNOSIS — Z1231 Encounter for screening mammogram for malignant neoplasm of breast: Secondary | ICD-10-CM

## 2019-10-09 ENCOUNTER — Ambulatory Visit
Admission: RE | Admit: 2019-10-09 | Discharge: 2019-10-09 | Disposition: A | Discharge status: Home / Self Care | Payer: BC Managed Care – PPO | Source: Ambulatory Visit | Attending: Obstetrics & Gynecology | Admitting: Obstetrics & Gynecology

## 2019-10-09 DIAGNOSIS — Z1231 Encounter for screening mammogram for malignant neoplasm of breast: Secondary | ICD-10-CM | POA: Diagnosis present

## 2020-10-13 ENCOUNTER — Other Ambulatory Visit: Payer: Self-pay | Admitting: Family Medicine

## 2020-10-13 DIAGNOSIS — Z1231 Encounter for screening mammogram for malignant neoplasm of breast: Secondary | ICD-10-CM

## 2020-10-21 ENCOUNTER — Ambulatory Visit
Admission: RE | Admit: 2020-10-21 | Discharge: 2020-10-21 | Disposition: A | Discharge status: Home / Self Care | Payer: BC Managed Care – PPO | Source: Ambulatory Visit | Attending: Family Medicine | Admitting: Family Medicine

## 2020-10-21 ENCOUNTER — Other Ambulatory Visit: Payer: Self-pay

## 2020-10-21 ENCOUNTER — Encounter (INDEPENDENT_AMBULATORY_CARE_PROVIDER_SITE_OTHER): Payer: Self-pay

## 2020-10-21 DIAGNOSIS — Z1231 Encounter for screening mammogram for malignant neoplasm of breast: Secondary | ICD-10-CM

## 2020-10-21 HISTORY — DX: Malignant (primary) neoplasm, unspecified: C80.1

## 2022-02-17 ENCOUNTER — Other Ambulatory Visit: Payer: Self-pay

## 2022-02-17 ENCOUNTER — Emergency Department
Admission: EM | Admit: 2022-02-17 | Discharge: 2022-02-17 | Disposition: A | Discharge status: Home / Self Care | Payer: BC Managed Care – PPO | Attending: Emergency Medicine | Admitting: Emergency Medicine

## 2022-02-17 ENCOUNTER — Emergency Department: Payer: BC Managed Care – PPO

## 2022-02-17 DIAGNOSIS — Z20822 Contact with and (suspected) exposure to covid-19: Secondary | ICD-10-CM | POA: Insufficient documentation

## 2022-02-17 DIAGNOSIS — R1030 Lower abdominal pain, unspecified: Secondary | ICD-10-CM | POA: Diagnosis not present

## 2022-02-17 DIAGNOSIS — D729 Disorder of white blood cells, unspecified: Secondary | ICD-10-CM

## 2022-02-17 DIAGNOSIS — E119 Type 2 diabetes mellitus without complications: Secondary | ICD-10-CM | POA: Diagnosis not present

## 2022-02-17 DIAGNOSIS — R519 Headache, unspecified: Secondary | ICD-10-CM | POA: Diagnosis present

## 2022-02-17 DIAGNOSIS — D72829 Elevated white blood cell count, unspecified: Secondary | ICD-10-CM | POA: Insufficient documentation

## 2022-02-17 LAB — CBC WITH DIFFERENTIAL/PLATELET
Abs Immature Granulocytes: 0.24 10*3/uL — ABNORMAL HIGH (ref 0.00–0.07)
Basophils Absolute: 0.1 10*3/uL (ref 0.0–0.1)
Basophils Relative: 0 %
Eosinophils Absolute: 0 10*3/uL (ref 0.0–0.5)
Eosinophils Relative: 0 %
HCT: 47.2 % — ABNORMAL HIGH (ref 36.0–46.0)
Hemoglobin: 16.2 g/dL — ABNORMAL HIGH (ref 12.0–15.0)
Immature Granulocytes: 1 %
Lymphocytes Relative: 7 %
Lymphs Abs: 1.7 10*3/uL (ref 0.7–4.0)
MCH: 30.2 pg (ref 26.0–34.0)
MCHC: 34.3 g/dL (ref 30.0–36.0)
MCV: 88.1 fL (ref 80.0–100.0)
Monocytes Absolute: 0.8 10*3/uL (ref 0.1–1.0)
Monocytes Relative: 3 %
Neutro Abs: 21 10*3/uL — ABNORMAL HIGH (ref 1.7–7.7)
Neutrophils Relative %: 89 %
Platelets: 403 10*3/uL — ABNORMAL HIGH (ref 150–400)
RBC: 5.36 MIL/uL — ABNORMAL HIGH (ref 3.87–5.11)
RDW: 13.4 % (ref 11.5–15.5)
WBC: 23.8 10*3/uL — ABNORMAL HIGH (ref 4.0–10.5)
nRBC: 0 % (ref 0.0–0.2)

## 2022-02-17 LAB — URINALYSIS, ROUTINE W REFLEX MICROSCOPIC
Bilirubin Urine: NEGATIVE
Glucose, UA: 150 mg/dL — AB
Hgb urine dipstick: NEGATIVE
Ketones, ur: NEGATIVE mg/dL
Leukocytes,Ua: NEGATIVE
Nitrite: NEGATIVE
Protein, ur: NEGATIVE mg/dL
Specific Gravity, Urine: 1.008 (ref 1.005–1.030)
pH: 7 (ref 5.0–8.0)

## 2022-02-17 LAB — LACTIC ACID, PLASMA: Lactic Acid, Venous: 1.4 mmol/L (ref 0.5–1.9)

## 2022-02-17 LAB — I-STAT CREATININE, ED: Creatinine, Ser: 0.9 mg/dL (ref 0.44–1.00)

## 2022-02-17 LAB — RESP PANEL BY RT-PCR (FLU A&B, COVID) ARPGX2
Influenza A by PCR: NEGATIVE
Influenza B by PCR: NEGATIVE
SARS Coronavirus 2 by RT PCR: NEGATIVE

## 2022-02-17 LAB — PROCALCITONIN: Procalcitonin: <0.1 ng/mL

## 2022-02-17 LAB — TROPONIN I (HIGH SENSITIVITY): Troponin I (High Sensitivity): 8 ng/L (ref <18)

## 2022-02-17 MED ORDER — METOCLOPRAMIDE HCL 5 MG/ML IJ SOLN
10.0000 mg | Freq: Once | INTRAMUSCULAR | Status: AC
  Filled 2022-02-17: qty 2

## 2022-02-17 MED ORDER — IOHEXOL 300 MG/ML  SOLN
100.0000 mL | Freq: Once | INTRAMUSCULAR | Status: AC | PRN
Start: 2022-02-17 — End: 2022-02-17
  Administered 2022-02-17: 100 mL via INTRAVENOUS
  Filled 2022-02-17: qty 100

## 2022-02-17 MED ORDER — METOCLOPRAMIDE HCL 5 MG/ML IJ SOLN
INTRAMUSCULAR | Status: AC
  Administered 2022-02-17: 10 mg via INTRAVENOUS
  Filled 2022-02-17: qty 2

## 2022-02-17 MED ORDER — DIPHENHYDRAMINE HCL 50 MG/ML IJ SOLN
12.5000 mg | Freq: Once | INTRAMUSCULAR | Status: AC
  Administered 2022-02-17: 12.5 mg via INTRAVENOUS
  Filled 2022-02-17: qty 1

## 2022-02-17 NOTE — ED Notes (Signed)
Pt had labs and urine done at [facility] walk in this morning.  ?

## 2022-02-17 NOTE — ED Provider Notes (Signed)
? ?[facility] ?Provider Note ? ? ? Event Date/Time  ? First MD Initiated Contact with Patient 02/17/22 1709   ?  (approximate) ? ? ?History  ? ?Abdominal Pain ? ? ?HPI ? ?Nicole Avila is a 51 y.o. female with history of diabetes who is diet-controlled who states that she comes in for abdominal and back pain.  Patient reports having severe abdominal pain radiating to her back about 2 weeks ago.  The pain went away but then she developed some right-sided back discomfort she was seen by Ortho and started on prednisone.  She reports that that has mostly resolved she denies any pain at this time.  She had another issue with her knee where she had a steroid injection to her right knee yesterday.  She went into the clinic who noted that her CBC shows significant elevated white count and recommended to go to ER to be further evaluated.  She does report having a headache for the past couple of days as well.  She reports intermittent right flank pain but mostly that has resolved. ? ?UA negative  ?Lipase ?CMP sugar is 257  ? ? ?Physical Exam  ? ?Triage Vital Signs: ?ED Triage Vitals  ?Enc Vitals Group  ?   BP 02/17/22 1434 (!) 153/71  ?   Pulse Rate 02/17/22 1434 92  ?   Resp 02/17/22 1434 18  ?   Temp 02/17/22 1434 99.6 ?F (37.6 ?C)  ?   Temp Source 02/17/22 1434 Oral  ?   SpO2 02/17/22 1434 96 %  ?   Weight 02/17/22 1455 230 lb (104.3 kg)  ?   Height 02/17/22 1455 5\' 10"  (1.778 m)  ?   Head Circumference --   ?   Peak Flow --   ?   Pain Score 02/17/22 1455 4  ?   Pain Loc --   ?   Pain Edu? --   ?   Excl. in [place]? --   ? ? ?Most recent vital signs: ?Vitals:  ? 02/17/22 1434  ?BP: (!) 153/71  ?Pulse: 92  ?Resp: 18  ?Temp: 99.6 ?F (37.6 ?C)  ?SpO2: 96%  ? ? ? ?General: Awake, no distress.  ?CV:  Good peripheral perfusion.  ?Resp:  Normal effort.  ?Abd:  No distention.  ?Other:  Abdomen is soft and nontender.  No flank tenderness.  No midline CTL spine tenderness.  Supple full range of motion neck.   Extraocular movements intact.  Pupils reactive bilaterally.  Cranial nerves appear intact. ?Right knee has full range of motion and it without any redness.  She denies any other abrasions. ? ?ED Results / Procedures / Treatments  ? ?Labs ?(all labs ordered are listed, but only abnormal results are displayed) ?Labs Reviewed  ?URINALYSIS, ROUTINE W REFLEX MICROSCOPIC - Abnormal; Notable for the following components:  ?    Result Value  ? Color, Urine STRAW (*)   ? APPearance CLEAR (*)   ? Glucose, UA 150 (*)   ? All other components within normal limits  ?CBC WITH DIFFERENTIAL/PLATELET - Abnormal; Notable for the following components:  ? WBC 23.8 (*)   ? RBC 5.36 (*)   ? Hemoglobin 16.2 (*)   ? HCT 47.2 (*)   ? Platelets 403 (*)   ? Neutro Abs 21.0 (*)   ? Abs Immature Granulocytes 0.24 (*)   ? All other components within normal limits  ?RESP PANEL BY RT-PCR (FLU A&B, COVID) [id number]  ?CULTURE, BLOOD (  ROUTINE X 2)  ?CULTURE, BLOOD (ROUTINE X 2)  ?PROCALCITONIN  ?LACTIC ACID, PLASMA  ?PROCALCITONIN  ?LACTIC ACID, PLASMA  ?I-STAT CREATININE, ED  ?POC URINE PREG, ED  ?TROPONIN I (HIGH SENSITIVITY)  ?TROPONIN I (HIGH SENSITIVITY)  ? ? ?RADIOLOGY ?I have reviewed the xray personally and agree with radiology read ? ? ?EKG my interpretation is normal sinus rate of 88 without any ST elevation or T wave inversions except for V2, normal intervals ? ?PROCEDURES: ? ?Critical Care performed: No ? ?Procedures ? ? ?MEDICATIONS ORDERED IN ED: ?Medications - No data to display ? ? ?IMPRESSION / MDM / ASSESSMENT AND PLAN / ED COURSE  ?I reviewed the triage vital signs and the nursing notes. ?             ?               ?Patient with a history of diet-controlled diabetes who comes in with concerns for elevated white count.  She actually reports feeling pretty well at this time other than a headache this been going on for the past 4 days and some occasional right-sided flank pain.  Is unclear what to make of this white count elevation but  given she did report some lower abdominal tenderness will get CT scan to further evaluate to make sure no other abscess.  Given the headache also get CT head. ? ?UA without evidence of UTI.  COVID, flu are negative.  White count elevated at 23,000 with a left shift.  Procalcitonin is completely negative.  Troponin negative.  CT imaging of the abdomen is negative.  The head is negative.  Reevaluate patient she is feeling better.  I rechecked the temperature and she remains afebrile.  She had a slightly elevated pulse rate of 90 to technically meet sepsis criteria so I have drawn blood cultures.  But her initial lactate was normal. ? ?I reevaluated patient and discussed the incidentally noted elevated white count.  The only other symptom she is having is a mild headache but it seems very unlikely to be meningitis given she is supple neck this is been going on for 4 days and given negative procalcitonin seems unlikely to be bacterial in nature.  Denies any history of herpes.  We discussed lumbar puncture but she like to hold off on this time and monitor symptoms at home.  She understands that if symptoms are getting worse or she develops a fever over 100 that she needs to return to the ER for repeat evaluation.  I suspect that some of the steroid injection could just be from the injection.  There is no evidence of septic joint on examination.  We discussed admission but at this time she feels comfortable going home given she is very well-appearing and reports feeling much better.  At this time she does not want to do the a lumbar puncture but will return if symptoms are worsening.  This seems reasonable given negative procalcitonin.  She is got no rash or other red flag symptoms. ? ?Prior to discharge I rechecked a temperature and it was 98.5 this without any fever reducers. ? ?I discussed the provisional nature of ED diagnosis, the treatment so far, the ongoing plan of care, follow up appointments and return  precautions with the patient and any family or support people present. They expressed understanding and agreed with the plan, discharged home. ? ? ? ? ? ?FINAL CLINICAL IMPRESSION(S) / ED DIAGNOSES  ? ?Final diagnoses:  ?Abnormal white blood  cell (WBC) count  ? ? ? ?Rx / DC Orders  ? ?ED Discharge Orders   ? ? None  ? ?  ? ? ? ?Note:  This document was prepared using Dragon voice recognition software and may include unintentional dictation errors. ?  ?Vanessa Riddle, MD ?02/17/22 2048 ? ?

## 2022-02-17 NOTE — Discharge Instructions (Addendum)
Work-up was reassuring other than your elevated white count I do not see obvious source of infection.  This could all just be from the steroids.  At this time it seems less likely to be something more serious however if develop a fever over 100, worsening headaches, confusion you need to return to the ER immediately for repeat evaluation.  Please follow-up with your primary doctor by Monday for recheck of your labs and return over the weekend if things are getting worse ? ?IMPRESSION: ?Left colonic diverticulosis.  No active diverticulitis. ?  ?No acute findings in the abdomen or pelvis. ?

## 2022-02-17 NOTE — ED Triage Notes (Signed)
First RN note:   ? ?Pt comes into the ED via [facility] clinic for abd and back pain.  Pt did complete urine and lab work today in [facility] clinic.  Pt's WBC came back at 24.   ?

## 2022-02-22 LAB — CULTURE, BLOOD (ROUTINE X 2)
Culture: NO GROWTH
Culture: NO GROWTH
Special Requests: ADEQUATE
Special Requests: ADEQUATE

## 2023-04-12 ENCOUNTER — Inpatient Hospital Stay
Admission: RE | Admit: 2023-04-12 | Discharge: 2023-04-12 | Disposition: A | Discharge status: Home / Self Care | Payer: Self-pay | Source: Ambulatory Visit | Attending: Orthopedic Surgery | Admitting: Orthopedic Surgery

## 2023-04-12 ENCOUNTER — Other Ambulatory Visit: Payer: Self-pay

## 2023-04-12 DIAGNOSIS — Z049 Encounter for examination and observation for unspecified reason: Secondary | ICD-10-CM

## 2023-04-19 ENCOUNTER — Ambulatory Visit: Payer: BC Managed Care – PPO | Admitting: Orthopedic Surgery

## 2023-04-25 NOTE — Progress Notes (Unsigned)
Referring Physician:  Jerl Mina, MD [address] [place],  [place] [address]  Primary Physician:  Jerl Mina, MD  History of Present Illness: 04/26/2023 Ms. Nicole Avila has a history of anemia, HTN, B12 deficiency, hyperlipidemia, and DM.    She complains a 1+ year history of constant LBP with left buttock and lateral thigh pain to knee. She had some right lateral thigh pain to knee that has improved. Pain is worse when she changes position in bed and with standing/walking. Some improvement with changing positions. No numbness or tingling. She has noticed some intermittent weakness in right leg pain.   Given prednisone taper by PCP on 03/14/23- this helped short term. Some relief with prn motrin.   Bowel/Bladder Dysfunction: none  Conservative measures:  Physical therapy: PT at [facility] last fall (?last spring)- did 4-6 weeks with minimal change.  Multimodal medical therapy including regular antiinflammatories:  prednisone, motrin, tylenol Injections: denies epidural steroid injections  Past Surgery: denies  Nicole Penton Liew has no symptoms of cervical myelopathy.  The symptoms are causing a significant impact on the patient's life.   Review of Systems:  A 10 point review of systems is negative, except for the pertinent positives and negatives detailed in the HPI.  Past Medical History: Past Medical History:  Diagnosis Date   Abnormal finding on Pap smear    Arthritis    knees   Cancer (HCC)    skin ca   Depression    panic attacks   Dysrhythmia    Genital herpes    HTN (hypertension)    Infertility, female    Motion sickness    cars if passenger   SVT (supraventricular tachycardia)    hx. fixed with ablation 2011    Past Surgical History: Past Surgical History:  Procedure Laterality Date   ABDOMINAL HYSTERECTOMY     ABLATION     cardiac 2011   no followed by cardio   APPENDECTOMY     ruptured   cyst removed - ankle      DIAGNOSTIC LAPAROSCOPY     HYSTEROSCOPY WITH D & C N/A 02/09/2016   Procedure: DILATATION AND CURETTAGE /HYSTEROSCOPY;  Surgeon: Elenora Fender Ward, MD;  Location: [facility] ORS;  Service: Gynecology;  Laterality: N/A;   KNEE ARTHROSCOPY Right 08/09/2017   Procedure: RIGHT ARTHROSCOPY KNEE WITH DEBRIDEMENT, PARTIAL MEDIAL MENISCECTOMY AND ABRASION CHONDROPLASTY;  Surgeon: Christena Flake, MD;  Location: [facility];  Service: Orthopedics;  Laterality: Right;   LABIAL ADHESION LYSIS     LAPAROSCOPIC HYSTERECTOMY Bilateral 04/14/2016   Procedure:  TOTAL LAPAROSCOPIC HYSTERECTOMY;  Surgeon: Elenora Fender Ward, MD;  Location: [facility] OR[facility];  Service: Gynecology;  Laterality: Bilateral;   LAPAROSCOPIC OVARIAN CYSTECTOMY Left     Allergies: Allergies as of 04/26/2023   (No Known Allergies)    Medications: Outpatient Encounter Medications as of 04/26/2023  Medication Sig   atorvastatin (LIPITOR) 10 MG tablet TAKE 1 TABLET BY MOUTH ONCE DAILY   cholecalciferol (VITAMIN D) 1000 units tablet Take 5,000 Units by mouth daily.    MOUNJARO 10 MG/0.5ML Pen SMARTSIG:10 Milligram(s) SUB-Q Once a Week   triamterene-hydrochlorothiazide (MAXZIDE-25) 37.5-25 MG tablet Take by mouth.   UNABLE TO FIND Take by mouth.   UNABLE TO FIND Take by mouth.   No facility-administered encounter medications on file as of 04/26/2023.    Social History: Social History   Tobacco Use   Smoking status: Never   Smokeless tobacco: Never   Tobacco  comments:    nonsmoker   Vaping Use   Vaping Use: Never used  Substance Use Topics   Alcohol use: No   Drug use: No    Family Medical History: Family History  Problem Relation Age of Onset   Coronary artery disease Other        family hx   Hemochromatosis Other        family hx   Breast cancer Cousin        mat cousin    Physical Examination: Vitals:   04/26/23 0908  BP: 116/72    General: Patient is well developed, well nourished, calm, collected, and in no apparent  distress. Attention to examination is appropriate.  Respiratory: Patient is breathing without any difficulty.   NEUROLOGICAL:     Awake, alert, oriented to person, place, and time.  Speech is clear and fluent. Fund of knowledge is appropriate.   Cranial Nerves: Pupils equal round and reactive to light.  Facial tone is symmetric.    Limited ROM of lumbar spine with pain on forward flexoin Mild central lower posterior lumbar tenderness.   No abnormal lesions on exposed skin.   Strength: Side Biceps Triceps Deltoid Interossei Grip Wrist Ext. Wrist Flex.  R 5 5 5 5 5 5 5   L 5 5 5 5 5 5 5    Side Iliopsoas Quads Hamstring PF DF EHL  R 5 5 5 5 5 5   L 5 5 5 5 5 5    Reflexes are 2+ and symmetric at the biceps, triceps, brachioradialis, patella and achilles.   Hoffman's is absent.  Clonus is not present.   Bilateral upper and lower extremity sensation is intact to light touch.     Gait is normal.    Medical Decision Making  Imaging: Lumbar xrays dated 03/14/23:  Impression:  Levocurvature centered at L3, grade 1 retrolisthesis of L2 on L3, and facet hypertrophy at L3-L4 through L5-S1, progressed since 2018.   Electronically signed By: Lesia Hausen M.D. On: 03/16/2023  09:52  I have personally reviewed the images and agree with the above interpretation.  Assessment and Plan: Ms. Goenner is a pleasant 52 y.o. female has  1+ year history of constant LBP with left buttock and lateral thigh pain to knee. She had some right lateral thigh pain to knee that has improved. Pain is worse when she changes position in bed and with standing/walking.   She has known TL scoliosis lumbar spondylosis and retrolisthesis L2-L3.   Treatment options discussed with patient and following plan made:   - Prescription for motrin 800mg . Reviewed dosing and side effects. Take with food. Stop OTC motrin and tylenol.  - MRI of lumbar spine to further evaluate lumbar radiculopathy. No improvement with  previous PT, time, or medications.  - Depending on results of MRI, may consider injections and/or revisiting PT.  - Will schedule phone visit to review MRI results once I get them back.   I spent a total of 35 minutes in face-to-face and non-face-to-face activities related to this patient's care today including review of outside records, review of imaging, review of symptoms, physical exam, discussion of differential diagnosis, discussion of treatment options, and documentation.   Thank you for involving me in the care of this patient.   Drake Leach PA-C Dept. of Neurosurgery

## 2023-04-26 ENCOUNTER — Ambulatory Visit (INDEPENDENT_AMBULATORY_CARE_PROVIDER_SITE_OTHER): Payer: BC Managed Care – PPO | Admitting: Orthopedic Surgery

## 2023-04-26 ENCOUNTER — Encounter: Payer: Self-pay | Admitting: Orthopedic Surgery

## 2023-04-26 VITALS — BP 116/72 | Ht 70.0 in | Wt 178.2 lb

## 2023-04-26 DIAGNOSIS — M419 Scoliosis, unspecified: Secondary | ICD-10-CM

## 2023-04-26 DIAGNOSIS — M4310 Spondylolisthesis, site unspecified: Secondary | ICD-10-CM

## 2023-04-26 DIAGNOSIS — M47816 Spondylosis without myelopathy or radiculopathy, lumbar region: Secondary | ICD-10-CM

## 2023-04-26 DIAGNOSIS — M4726 Other spondylosis with radiculopathy, lumbar region: Secondary | ICD-10-CM | POA: Diagnosis not present

## 2023-04-26 DIAGNOSIS — M5416 Radiculopathy, lumbar region: Secondary | ICD-10-CM

## 2023-04-26 MED ORDER — IBUPROFEN 800 MG PO TABS: 800.0000 mg | ORAL_TABLET | Freq: Three times a day (TID) | ORAL | 1 refills | Status: AC | PRN

## 2023-04-26 NOTE — Patient Instructions (Signed)
It was so nice to see you today. Thank you so much for coming in.    Your lower back xrays showed some wear and tear in your back and I think this may be causing your pain.   I want to get an MRI of your lower to look into things further. We will get this approved through your insurance and [facility] will call you to schedule the appointment.   I sent a prescription for motrin 800mg  to help with pain and inflammation. Take as directed with food. Stop the over the counter motrin and tylenol.   We will call you to schedule a phone visit with me to review the MRI results once I get them back.   Please do not hesitate to call if you have any questions or concerns. You can also message me in MyChart.   Drake Leach PA-C [phone]

## 2023-05-03 ENCOUNTER — Ambulatory Visit
Admission: RE | Admit: 2023-05-03 | Discharge: 2023-05-03 | Disposition: A | Discharge status: Home / Self Care | Payer: BC Managed Care – PPO | Source: Ambulatory Visit | Attending: Orthopedic Surgery | Admitting: Orthopedic Surgery

## 2023-05-03 DIAGNOSIS — M5416 Radiculopathy, lumbar region: Secondary | ICD-10-CM | POA: Insufficient documentation

## 2023-05-03 DIAGNOSIS — M419 Scoliosis, unspecified: Secondary | ICD-10-CM | POA: Insufficient documentation

## 2023-05-03 DIAGNOSIS — M47816 Spondylosis without myelopathy or radiculopathy, lumbar region: Secondary | ICD-10-CM | POA: Diagnosis present

## 2023-05-03 DIAGNOSIS — M4310 Spondylolisthesis, site unspecified: Secondary | ICD-10-CM | POA: Diagnosis present

## 2023-05-16 NOTE — Progress Notes (Unsigned)
Telephone Visit- Progress Note: Referring Physician:  No referring provider defined for this encounter.  Primary Physician:  Jerl Mina, MD  This visit was performed via telephone.  Patient location: home Provider location: office  I spent a total of 10 minutes non-face-to-face activities for this visit on the date of this encounter including review of current clinical condition and response to treatment.    Patient has given verbal consent to this telephone visits and we reviewed the limitations of a telephone visit. Patient wishes to proceed.    Chief Complaint:  review lumbar MRI scan  History of Present Illness: Zakara Beaird Copeman is a 52 y.o. female has a history of anemia, HTN, B12 deficiency, hyperlipidemia, and DM.    Last seen by me on 04/26/23- she has known TL scoliosis lumbar spondylosis and retrolisthesis L2-L3.   Lumbar MRI ordered and phone visit scheduled to review it.   Primary complaint is constant LBP and left buttock pain. Left leg pain is better. No pain in right buttock. Pain is worse when she changes position in bed and with standing/walking.  No numbness or tingling.    Conservative measures:  Physical therapy: PT at [facility] last fall (?last spring)- did 4-6 weeks with minimal change.  Multimodal medical therapy including regular antiinflammatories:  prednisone, motrin, tylenol Injections: denies epidural steroid injections   Past Surgery: denies    Exam: No exam done as this was a telephone encounter.     Imaging: MRI of lumbar spine dated 05/03/23:  FINDINGS: Segmentation: Conventional anatomy assumed, with the last open disc space designated L5-S1.Concordant with prior imaging.   Alignment:  Mild convex left scoliosis.   Vertebrae: No worrisome osseous lesion, acute fracture or pars defect. The visualized sacroiliac joints appear unremarkable.   Conus medullaris: Extends to the L1-2 level and appears normal.   Paraspinal and other  soft tissues: No significant paraspinal findings.   Disc levels:   Sagittal images demonstrate no significant disc space findings within the visualized lower thoracic spine.   L1-2: Mild loss of disc height with mild disc bulging and bilateral facet hypertrophy. No spinal stenosis or nerve root encroachment.   L2-3: Mild loss of disc height with mild disc bulging and bilateral facet hypertrophy. Extraforaminal annular rent on the left. No spinal stenosis or nerve root encroachment.   L3-4: Preserved disc height with annular disc bulging eccentric to the left and an extraforaminal rent on the left. Moderate facet hypertrophy. Mild narrowing of the left lateral recess and left foramen without definite nerve root encroachment. The central spinal canal is patent.   L4-5: Mild loss of disc height with mild disc bulging eccentric to the left. Moderate asymmetric facet hypertrophy on the left. Mild left foraminal narrowing without nerve root encroachment. The spinal canal is patent.   L5-S1: Mild disc bulging with a right foraminal annular rent. Mild bilateral facet hypertrophy. No spinal stenosis or nerve root encroachment.   IMPRESSION: 1. No acute findings or clear explanation for the patient's symptoms. 2. Mild multilevel spondylosis with disc bulging and facet hypertrophy as described. There is mild left foraminal narrowing at L3-4 and L4-5 without nerve root encroachment. 3. No high-grade spinal stenosis or definite nerve root encroachment.     Electronically Signed   By: Carey Bullocks M.D.   On: 05/09/2023 16:44  I have personally reviewed the images and agree with the above interpretation.  Assessment and Plan: Ms. Eyerman is a pleasant 52 y.o. female with primary complaint of constant  LBP and left buttock pain. Left leg pain is better. No pain in right buttock. P  She has known diffuse lumbar spondylosis with mild narrowing of the left lateral recess and left  foramen without definite nerve root encroachment at L3-L4 and mild left foraminal narrowing without nerve root encroachment at L4-L5. LBP/buttock pain is likely facet mediated.   Treatment options discussed with patient and following plan made:   - Discussed revisiting PT. She did in fall of 2023 with no relief. She declines.  - Continue current medications including motrin. Reviewed dosing and side effects.  - Referral to PMR at [facility] to discuss possible lumbar injections.  - Will schedule phone visit in 6-8 weeks for follow up.   Drake Leach PA-C Neurosurgery

## 2023-05-17 ENCOUNTER — Encounter: Payer: Self-pay | Admitting: Orthopedic Surgery

## 2023-05-17 ENCOUNTER — Ambulatory Visit (INDEPENDENT_AMBULATORY_CARE_PROVIDER_SITE_OTHER): Payer: Self-pay | Admitting: Orthopedic Surgery

## 2023-05-17 DIAGNOSIS — M419 Scoliosis, unspecified: Secondary | ICD-10-CM

## 2023-05-17 DIAGNOSIS — M47816 Spondylosis without myelopathy or radiculopathy, lumbar region: Secondary | ICD-10-CM

## 2023-07-05 ENCOUNTER — Telehealth: Payer: Self-pay | Admitting: Orthopedic Surgery

## 2023-08-07 IMAGING — CT CT ABD-PELV W/ CM
2 of 5 series · 16 of 46 positions shown, 18 images · IV contrast (agent unspecified)
Comparison: None.

CLINICAL DATA: Abdominal pain, back pain

EXAM:
CT ABDOMEN AND PELVIS WITH CONTRAST
TECHNIQUE: Multidetector CT imaging of the abdomen and pelvis was performed
using the standard protocol following bolus administration of
intravenous contrast.

[Series 2: routine abd/pel with · axial · 0.94mm/px · z∈[-870,-360]mm · 13 of 114 slices shown, 15 images]
[im 6/114  soft-tissue]
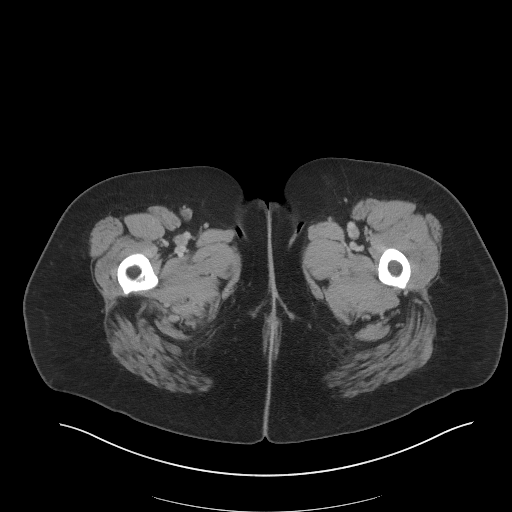
[im 6/114  bone]
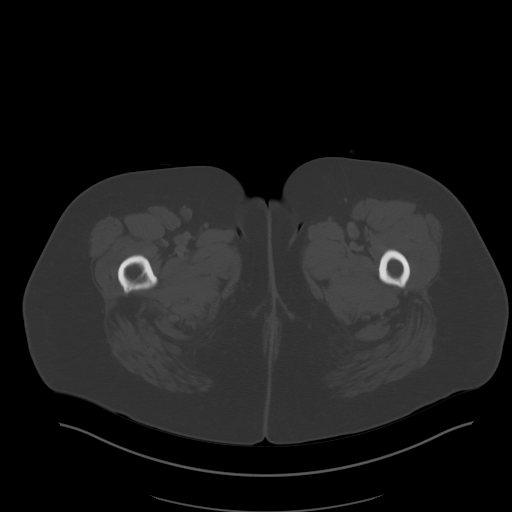
[im 18/114  soft-tissue]
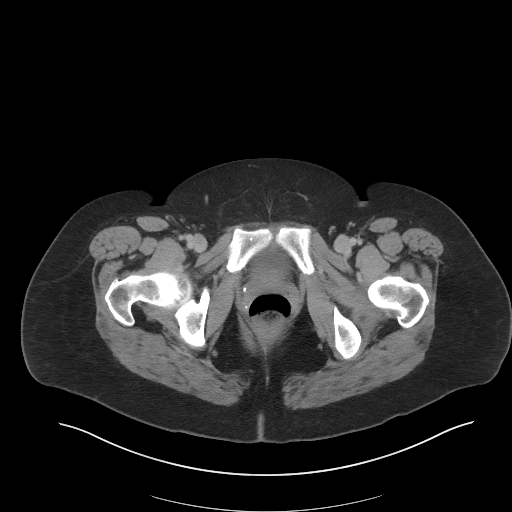
[im 24/114  soft-tissue]
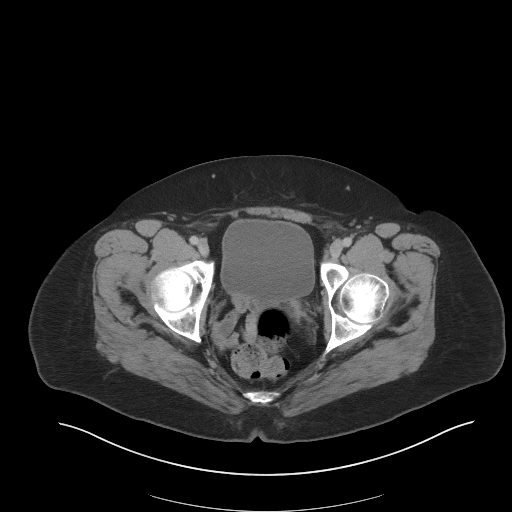
[im 30/114  soft-tissue]
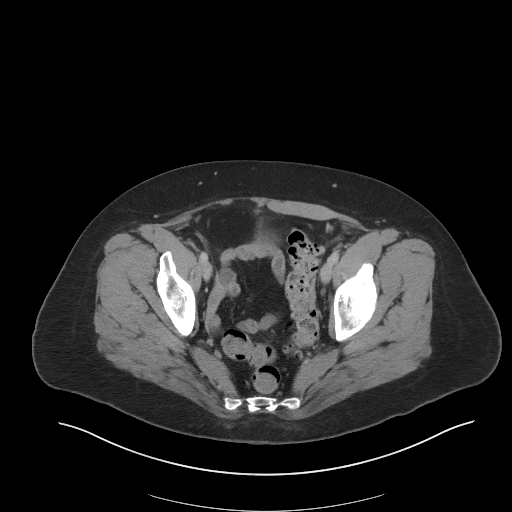
[im 42/114  soft-tissue]
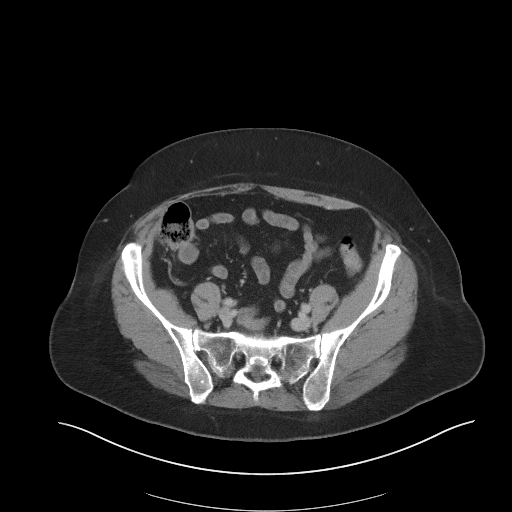
[im 48/114  soft-tissue]
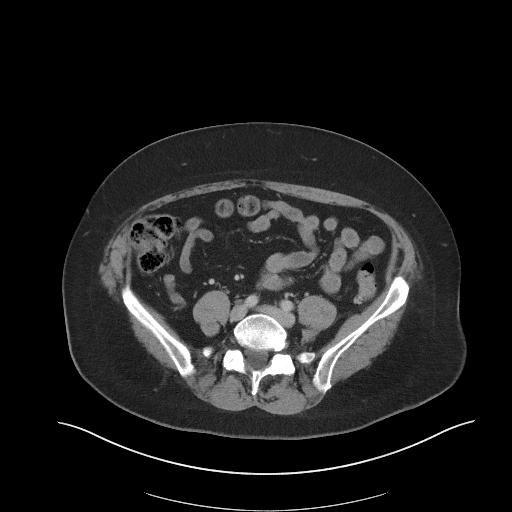
[im 60/114  soft-tissue]
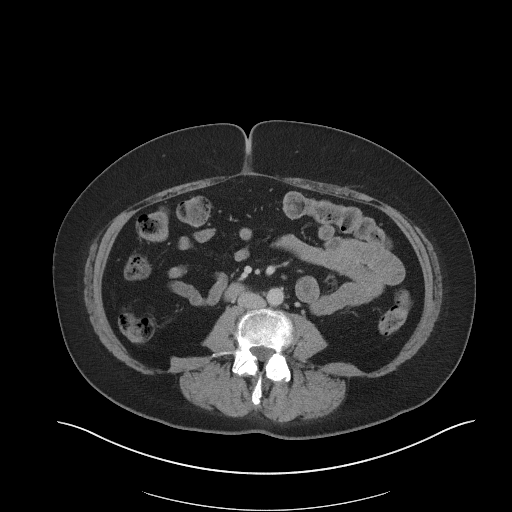
[im 66/114  soft-tissue]
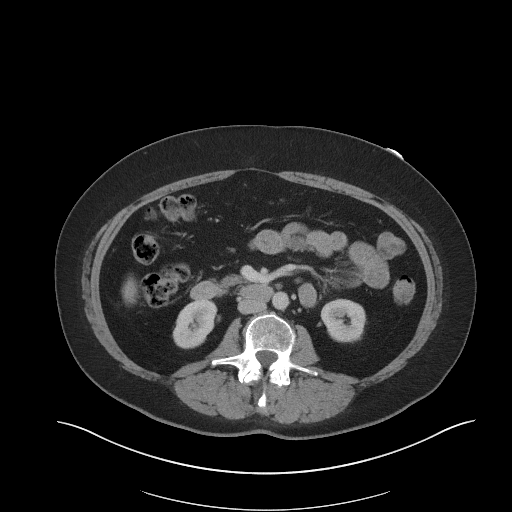
[im 72/114  soft-tissue]
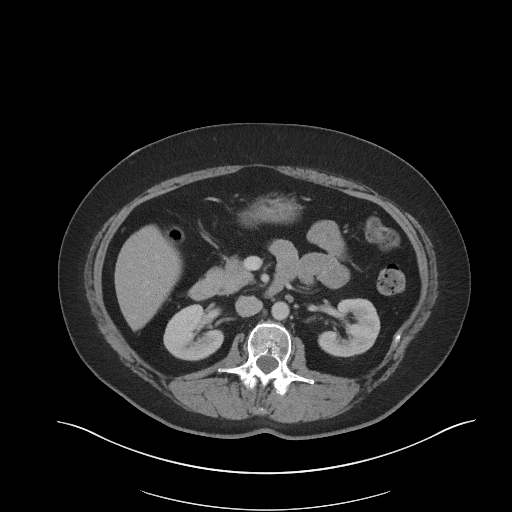
[im 72/114  bone]
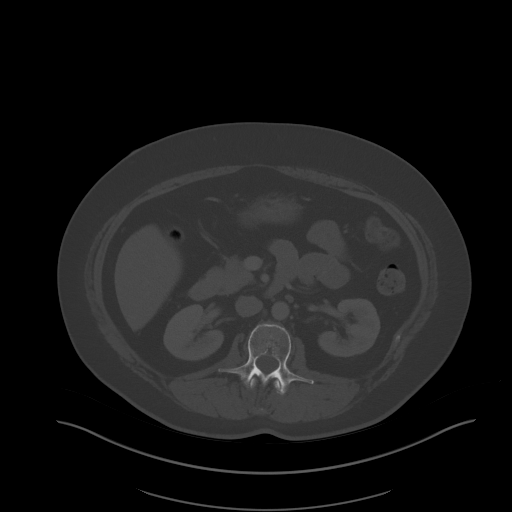
[im 84/114  soft-tissue]
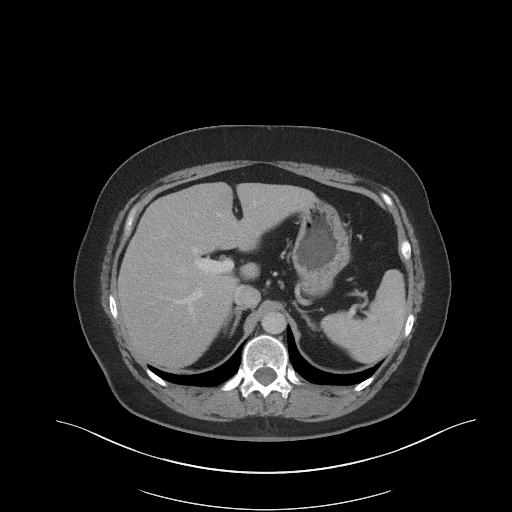
[im 90/114  soft-tissue]
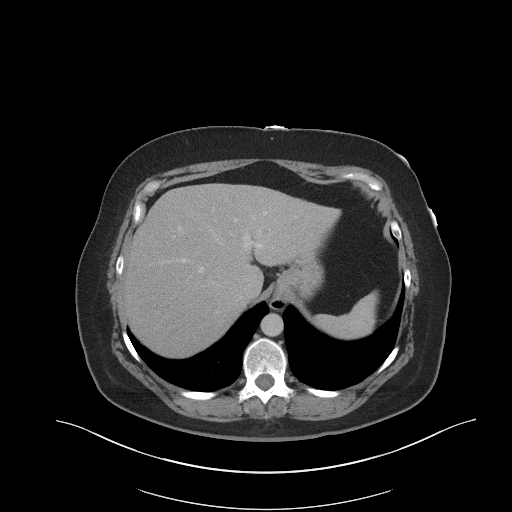
[im 96/114  soft-tissue]
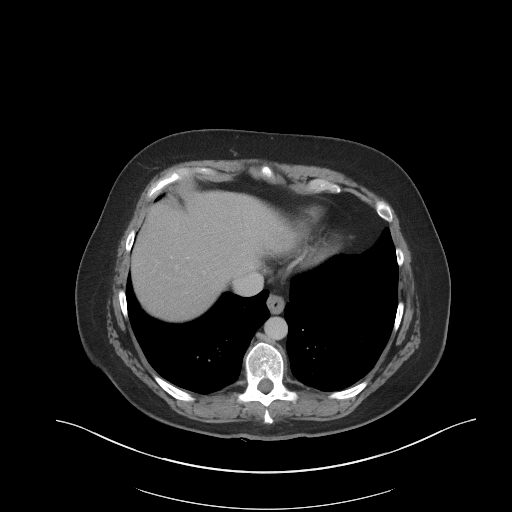
[im 108/114  soft-tissue]
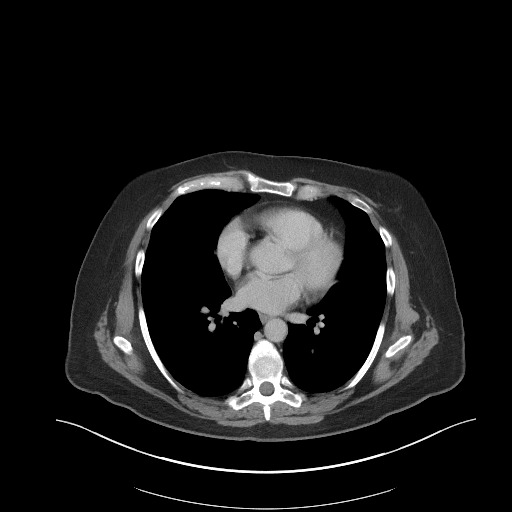

[Series 5: coronal st · coronal · 0.90mm/px · 3 of 103 slices shown]
[im 35/103  soft-tissue]
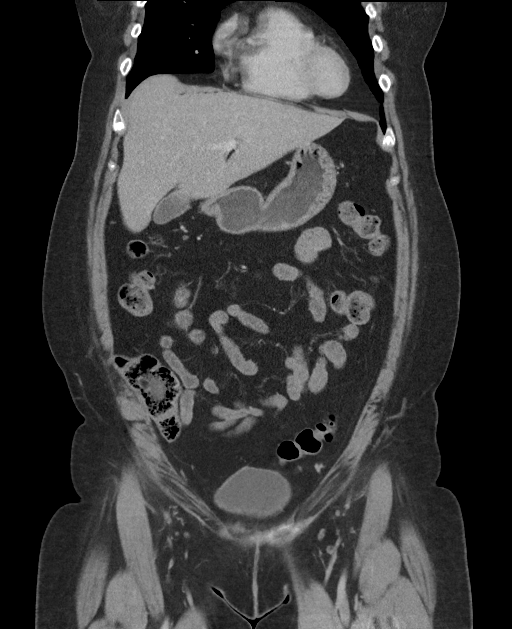
[im 46/103  soft-tissue]
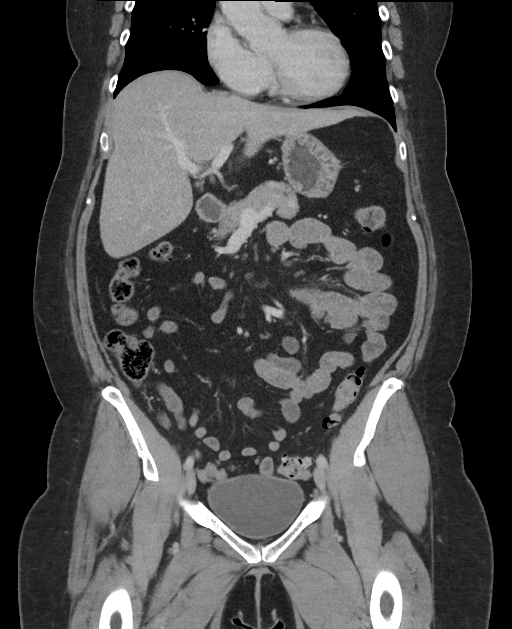
[im 57/103  soft-tissue]
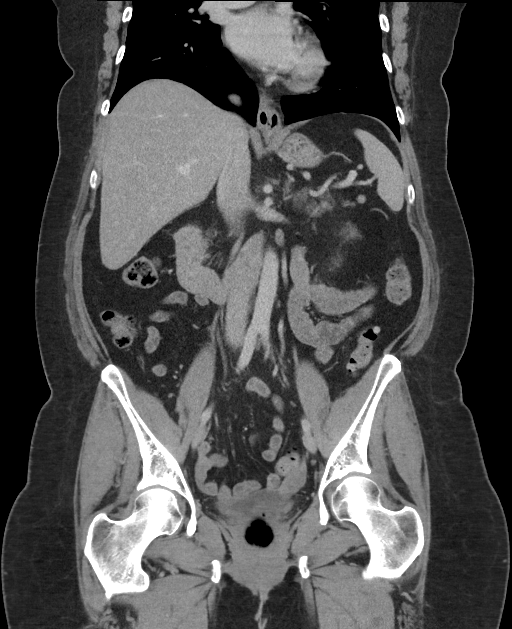

[16 of 46 positions shown; findings below may reference images not displayed]

RADIATION DOSE REDUCTION: This exam was performed according to the
departmental dose-optimization program which includes automated
exposure control, adjustment of the mA and/or kV according to
patient size and/or use of iterative reconstruction technique.

CONTRAST:  100mL OMNIPAQUE IOHEXOL 300 MG/ML  SOLN
FINDINGS: Lower chest: Lung bases are clear. No effusions. Heart is normal
size.

Hepatobiliary: No focal hepatic abnormality. Gallbladder
unremarkable.

Pancreas: No focal abnormality or ductal dilatation.

Spleen: No focal abnormality.  Normal size.

Adrenals/Urinary Tract: No adrenal abnormality. No focal renal
abnormality. No stones or hydronephrosis. Urinary bladder is
unremarkable.

Stomach/Bowel: Left colonic diverticulosis. No active
diverticulitis. Stomach and small bowel decompressed, unremarkable.
Appendix not visualized. No inflammatory process in the right lower
quadrant.

Vascular/Lymphatic: No evidence of aneurysm or adenopathy.

Reproductive: Prior hysterectomy.  No adnexal masses.

Other: No free fluid or free air.

Musculoskeletal: No acute bony abnormality.
IMPRESSION: Left colonic diverticulosis.  No active diverticulitis.

No acute findings in the abdomen or pelvis.

## 2023-11-13 ENCOUNTER — Other Ambulatory Visit: Payer: Self-pay | Admitting: Family Medicine

## 2023-11-13 DIAGNOSIS — Z1231 Encounter for screening mammogram for malignant neoplasm of breast: Secondary | ICD-10-CM

## 2023-11-30 ENCOUNTER — Ambulatory Visit
Admission: RE | Admit: 2023-11-30 | Discharge: 2023-11-30 | Disposition: A | Discharge status: Home / Self Care | Payer: BC Managed Care – PPO | Source: Ambulatory Visit | Attending: Family Medicine | Admitting: Family Medicine

## 2023-11-30 DIAGNOSIS — Z1231 Encounter for screening mammogram for malignant neoplasm of breast: Secondary | ICD-10-CM | POA: Insufficient documentation

## 2024-01-02 ENCOUNTER — Other Ambulatory Visit: Payer: Self-pay

## 2024-01-02 ENCOUNTER — Emergency Department
Admission: EM | Admit: 2024-01-02 | Discharge: 2024-01-02 | Disposition: A | Discharge status: Home / Self Care | Payer: BC Managed Care – PPO | Attending: Emergency Medicine | Admitting: Emergency Medicine

## 2024-01-02 ENCOUNTER — Emergency Department: Payer: BC Managed Care – PPO

## 2024-01-02 DIAGNOSIS — I10 Essential (primary) hypertension: Secondary | ICD-10-CM | POA: Insufficient documentation

## 2024-01-02 DIAGNOSIS — R0789 Other chest pain: Secondary | ICD-10-CM | POA: Diagnosis present

## 2024-01-02 DIAGNOSIS — R079 Chest pain, unspecified: Secondary | ICD-10-CM

## 2024-01-02 LAB — BASIC METABOLIC PANEL
Anion gap: 12 (ref 5–15)
BUN: 17 mg/dL (ref 6–20)
CO2: 25 mmol/L (ref 22–32)
Calcium: 9.6 mg/dL (ref 8.9–10.3)
Chloride: 100 mmol/L (ref 98–111)
Creatinine, Ser: 0.91 mg/dL (ref 0.44–1.00)
GFR, Estimated: >60 mL/min (ref >60)
Glucose, Bld: 91 mg/dL (ref 70–99)
Potassium: 3.5 mmol/L (ref 3.5–5.1)
Sodium: 137 mmol/L (ref 135–145)

## 2024-01-02 LAB — CBC
HCT: 41.3 % (ref 36.0–46.0)
Hemoglobin: 14.4 g/dL (ref 12.0–15.0)
MCH: 30.6 pg (ref 26.0–34.0)
MCHC: 34.9 g/dL (ref 30.0–36.0)
MCV: 87.7 fL (ref 80.0–100.0)
Platelets: 317 10*3/uL (ref 150–400)
RBC: 4.71 MIL/uL (ref 3.87–5.11)
RDW: 13.1 % (ref 11.5–15.5)
WBC: 7.2 10*3/uL (ref 4.0–10.5)
nRBC: 0 % (ref 0.0–0.2)

## 2024-01-02 LAB — TROPONIN I (HIGH SENSITIVITY): Troponin I (High Sensitivity): 3 ng/L (ref <18)

## 2024-01-02 NOTE — ED Triage Notes (Signed)
 Pt to ED for chest pain started today. Reports feeling unwell last night with some shob. Pain radiates to left shoulder.

## 2024-01-02 NOTE — ED Provider Triage Note (Signed)
 Emergency Medicine Provider Triage Evaluation Note  Nicole Avila , a 53 y.o. female  was evaluated in triage.  Pt complains of cp, hx of ablation for wpw; sx started yest.  Review of Systems  Positive:  Negative:   Physical Exam  BP (!) 157/88 (BP Location: Left Arm)   Pulse 86   Temp 97.8 F (36.6 C) (Oral)   Resp 18   Ht 5' 10 (1.778 m)   Wt 79.4 kg   LMP 03/13/2016   SpO2 100%   BMI 25.11 kg/m  Gen:   Awake, no distress   Resp:  Normal effort  MSK:   Moves extremities without difficulty  Other:    Medical Decision Making  Medically screening exam initiated at 2:13 PM.  Appropriate orders placed.  Nicole Avila was informed that the remainder of the evaluation will be completed by another provider, this initial triage assessment does not replace that evaluation, and the importance of remaining in the ED until their evaluation is complete.     Gasper Devere ORN, PA-C 01/02/24 1413

## 2024-01-02 NOTE — ED Provider Notes (Signed)
 [facility] Provider Note    Event Date/Time   First MD Initiated Contact with Patient 01/02/24 1716     (approximate)  History   Chief Complaint: Chest Pain  HPI  Preethi Scantlebury Somera is a 53 y.o. female with a past medical history of depression, hypertension, SVT, presents to the emergency department for chest discomfort since yesterday.  According to the patient yesterday she states she was bending over when she had a pain in the back of her left chest.  States throughout the day she has continued to have some discomfort in this area.  Patient was concerned and wanted to make sure was not her heart/came to the emergency department.  Patient denies any fever cough congestion.  Denies any shortness of breath nausea or diaphoresis.  Physical Exam   Triage Vital Signs: ED Triage Vitals  Encounter Vitals Group     BP 01/02/24 1406 (!) 157/88     Systolic BP Percentile --      Diastolic BP Percentile --      Pulse Rate 01/02/24 1406 86     Resp 01/02/24 1406 18     Temp 01/02/24 1406 97.8 F (36.6 C)     Temp Source 01/02/24 1406 Oral     SpO2 01/02/24 1406 100 %     Weight 01/02/24 1410 175 lb (79.4 kg)     Height 01/02/24 1410 5' 10 (1.778 m)     Head Circumference --      Peak Flow --      Pain Score 01/02/24 1410 6     Pain Loc --      Pain Education --      Exclude from Growth Chart --     Most recent vital signs: Vitals:   01/02/24 1406  BP: (!) 157/88  Pulse: 86  Resp: 18  Temp: 97.8 F (36.6 C)  SpO2: 100%    General: Awake, no distress.  CV:  Good peripheral perfusion.  Regular rate and rhythm  Resp:  Normal effort.  Equal breath sounds bilaterally.  Abd:  No distention.  ED Results / Procedures / Treatments   EKG  EKG viewed and interpreted by myself shows a normal sinus rhythm 82 bpm with a narrow QRS, normal axis, normal intervals, no concerning ST changes.  RADIOLOGY  I have reviewed and interpreted chest x-ray images.  No  consolidation on my evaluation. Radiology has read the x-ray is negative   MEDICATIONS ORDERED IN ED: Medications - No data to display   IMPRESSION / MDM / ASSESSMENT AND PLAN / ED COURSE  I reviewed the triage vital signs and the nursing notes.  Patient's presentation is most consistent with acute presentation with potential threat to life or bodily function.  Patient presents to the emergency department for left-sided chest pain ever since bending over yesterday evening.  Patient states the pain has been somewhat intermittent throughout the day.  Denies any nausea or diaphoresis or shortness of breath.  Overall patient appears well reassuring physical exam.  Does state slight discomfort with chest palpation.  CBC is normal with normal white blood cell count, chemistry is normal.  Troponin is reassuringly negative.  Patient's EKG and chest x-ray are reassuring as well.  Given the patient's reassuring workup reassuring physical exam I believe the patient is safe for discharge home with outpatient follow-up.  Patient is agreeable to this plan of care.  FINAL CLINICAL IMPRESSION(S) / ED DIAGNOSES   Chest pain  Note:  This document was prepared using Dragon voice recognition software and may include unintentional dictation errors.   Dorothyann Drivers, MD 01/02/24 1731
